# Patient Record
Sex: Female | Born: 1965 | Race: Black or African American | Hispanic: No | Marital: Single | Smoking: Former smoker
Health system: Southern US, Community
[De-identification: ages and names within clinical notes are randomized; demographics above are authoritative.]

## PROBLEM LIST (undated history)

## (undated) DIAGNOSIS — I890 Lymphedema, not elsewhere classified: Secondary | ICD-10-CM

## (undated) DIAGNOSIS — J45909 Unspecified asthma, uncomplicated: Secondary | ICD-10-CM

## (undated) HISTORY — PX: TUBAL LIGATION: SHX77

## (undated) HISTORY — DX: Lymphedema, not elsewhere classified: I89.0

---

## 2011-07-22 ENCOUNTER — Emergency Department: Payer: Self-pay | Admitting: Emergency Medicine

## 2011-09-04 ENCOUNTER — Emergency Department (HOSPITAL_COMMUNITY)
Admission: EM | Admit: 2011-09-04 | Discharge: 2011-09-04 | Disposition: A | Discharge status: Home / Self Care | Payer: Self-pay | Attending: Emergency Medicine | Admitting: Emergency Medicine

## 2011-09-04 DIAGNOSIS — Z87891 Personal history of nicotine dependence: Secondary | ICD-10-CM | POA: Insufficient documentation

## 2011-09-04 DIAGNOSIS — J40 Bronchitis, not specified as acute or chronic: Secondary | ICD-10-CM | POA: Insufficient documentation

## 2012-01-02 ENCOUNTER — Emergency Department (HOSPITAL_COMMUNITY)
Admission: EM | Admit: 2012-01-02 | Discharge: 2012-01-02 | Disposition: A | Discharge status: Home / Self Care | Payer: Self-pay | Attending: Emergency Medicine | Admitting: Emergency Medicine

## 2012-01-02 ENCOUNTER — Encounter (HOSPITAL_COMMUNITY): Payer: Self-pay | Admitting: *Deleted

## 2012-01-02 ENCOUNTER — Emergency Department (HOSPITAL_COMMUNITY): Payer: Self-pay

## 2012-01-02 DIAGNOSIS — R059 Cough, unspecified: Secondary | ICD-10-CM | POA: Insufficient documentation

## 2012-01-02 DIAGNOSIS — J40 Bronchitis, not specified as acute or chronic: Secondary | ICD-10-CM

## 2012-01-02 DIAGNOSIS — J069 Acute upper respiratory infection, unspecified: Secondary | ICD-10-CM | POA: Insufficient documentation

## 2012-01-02 DIAGNOSIS — R05 Cough: Secondary | ICD-10-CM | POA: Insufficient documentation

## 2012-01-02 MED ORDER — AZITHROMYCIN 250 MG PO TABS: 250.0000 mg | ORAL_TABLET | Freq: Every day | ORAL | Status: AC

## 2012-01-02 MED ORDER — BENZONATATE 100 MG PO CAPS: 100.0000 mg | ORAL_CAPSULE | Freq: Three times a day (TID) | ORAL | Status: AC

## 2012-01-02 NOTE — ED Provider Notes (Signed)
History     CSN: [id number]  Arrival date & time 01/02/12  4098   First MD Initiated Contact with Patient 01/02/12 [phone]      Chief Complaint  Patient presents with  . URI    (Consider location/radiation/quality/duration/timing/severity/associated sxs/prior treatment) HPI  Pt presents to the ED with complaints of flu-like symptoms of cough, congestion, sore throat, fever.  The patient states that the symptoms started 1 week ago.  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 The patient denies headaches, neck pain, weakness, vision changes, severe abdominal pain, inability to eat or drink, difficulty breathing, SOB, wheezing, chest pain. The patient has tried cough medicine, NSAIDS, and rest but has only felt mild relief.    History reviewed. No pertinent past medical history.  History reviewed. No pertinent past surgical history.  No family history on file.  History  Substance Use Topics  . Smoking status: Current Some Day Smoker  . Smokeless tobacco: Not on file  . Alcohol Use: Yes     ocassionally    OB History    Grav Para Term Preterm Abortions TAB SAB Ect Mult Living                  Review of Systems  All other systems reviewed and are negative.    Allergies  Review of patient's allergies indicates no known allergies.  Home Medications   Current Outpatient Rx  Name Route Sig Dispense Refill  . VITAMIN C PO Oral Take 1 tablet by mouth daily.    Marland Kitchen VITAMIN B-12 PO Oral Take 1 tablet by mouth daily.    . IBUPROFEN 200 MG PO TABS Oral Take 600 mg by mouth every 6 (six) hours as needed. For pain.    Marland Kitchen AZITHROMYCIN 250 MG PO TABS Oral Take 1 tablet (250 mg total) by mouth daily. Take first 2 tablets together, then 1 every day until finished. 6 tablet 0  . BENZONATATE 100 MG PO CAPS Oral Take 1 capsule (100 mg total) by mouth every 8 (eight) hours. 21 capsule 0    BP 119/84  Pulse  78  Temp(Src) 97.9 F (36.6 C) (Oral)  Resp 16  Wt 205 lb (92.987 kg)  SpO2 100%  LMP 12/12/2011  Physical Exam  Nursing note and vitals reviewed. Constitutional: She appears well-developed and well-nourished.  HENT:  Head: Normocephalic and atraumatic.  Nose: Rhinorrhea present.  Eyes: Conjunctivae are normal. Pupils are equal, round, and reactive to light.  Neck: Trachea normal, normal range of motion and full passive range of motion without pain. Neck supple.  Cardiovascular: Normal rate, regular rhythm and normal pulses.   Pulmonary/Chest: Effort normal. She has no wheezes. Chest wall is not dull to percussion. She exhibits no tenderness, no crepitus, no edema, no deformity and no retraction.  Abdominal: Soft. Normal appearance and bowel sounds are normal.  Musculoskeletal: Normal range of motion.  Lymphadenopathy:       Head (right side): No submental, no submandibular, no tonsillar, no preauricular, no posterior auricular and no occipital adenopathy present.       Head (left side): No submental, no submandibular, no tonsillar, no preauricular, no posterior auricular and no occipital adenopathy present.    She has no cervical adenopathy.    She has no axillary adenopathy.  Neurological: She is alert. She has normal strength.  Skin: Skin is warm, dry and intact.  Psychiatric: Her  speech is normal. Cognition and memory are normal.    ED Course  Procedures (including critical care time)  Labs Reviewed - No data to display Dg Chest 2 View  01/02/2012  *RADIOLOGY REPORT*  Clinical Data: Cough, congestion  CHEST - 2 VIEW  Comparison: None.  Findings: Normal heart cardiac silhouette and mediastinal contours. Evaluation of the retrosternal clear space is limited secondary to overlying osseous and soft tissue structures.  There is mild peribronchial thickening without focal airspace opacity.  No pleural effusion or pneumothorax.  Unchanged bones.  IMPRESSION: Findings suggestive of  airways disease/bronchitis.  No focal airspace opacities to suggest pneumonia.  Original Report Authenticated By: Waynard Reeds, M.D.     1. 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        Dorthula Matas, PA 01/02/12 1455

## 2012-01-02 NOTE — ED Notes (Signed)
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

## 2012-01-03 NOTE — ED Provider Notes (Signed)
Medical screening examination/treatment/procedure(s) were conducted as a shared visit with non-physician practitioner(s) and myself.  I personally evaluated the patient during the encounter   Brynlei Klausner, MD 01/03/12 0904 

## 2012-09-13 IMAGING — CR DG CHEST 2V
2 series · 2 of 2 positions shown · non-contrast
Comparison: None.

CLINICAL DATA: Cough, congestion

CHEST - 2 VIEW

[w chest pa]
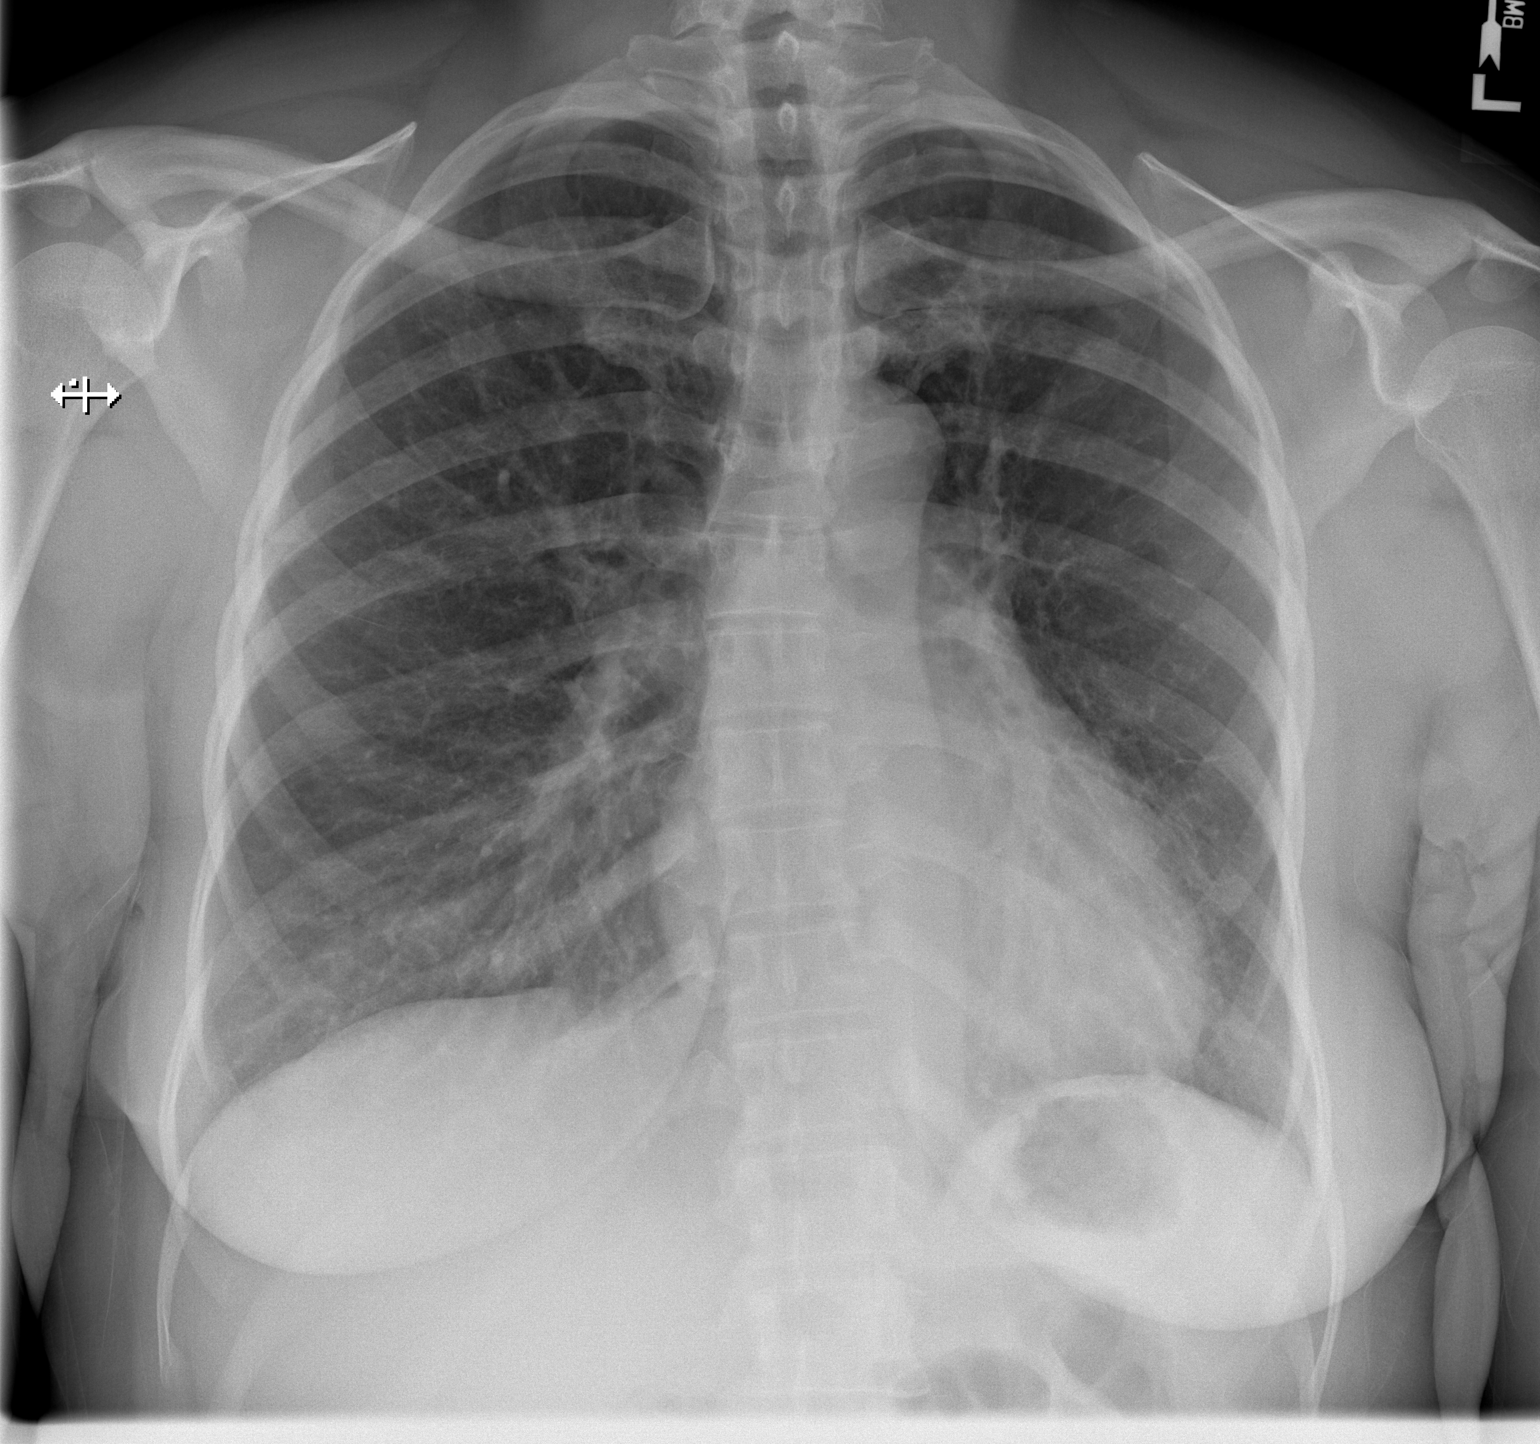

[w chest lat]
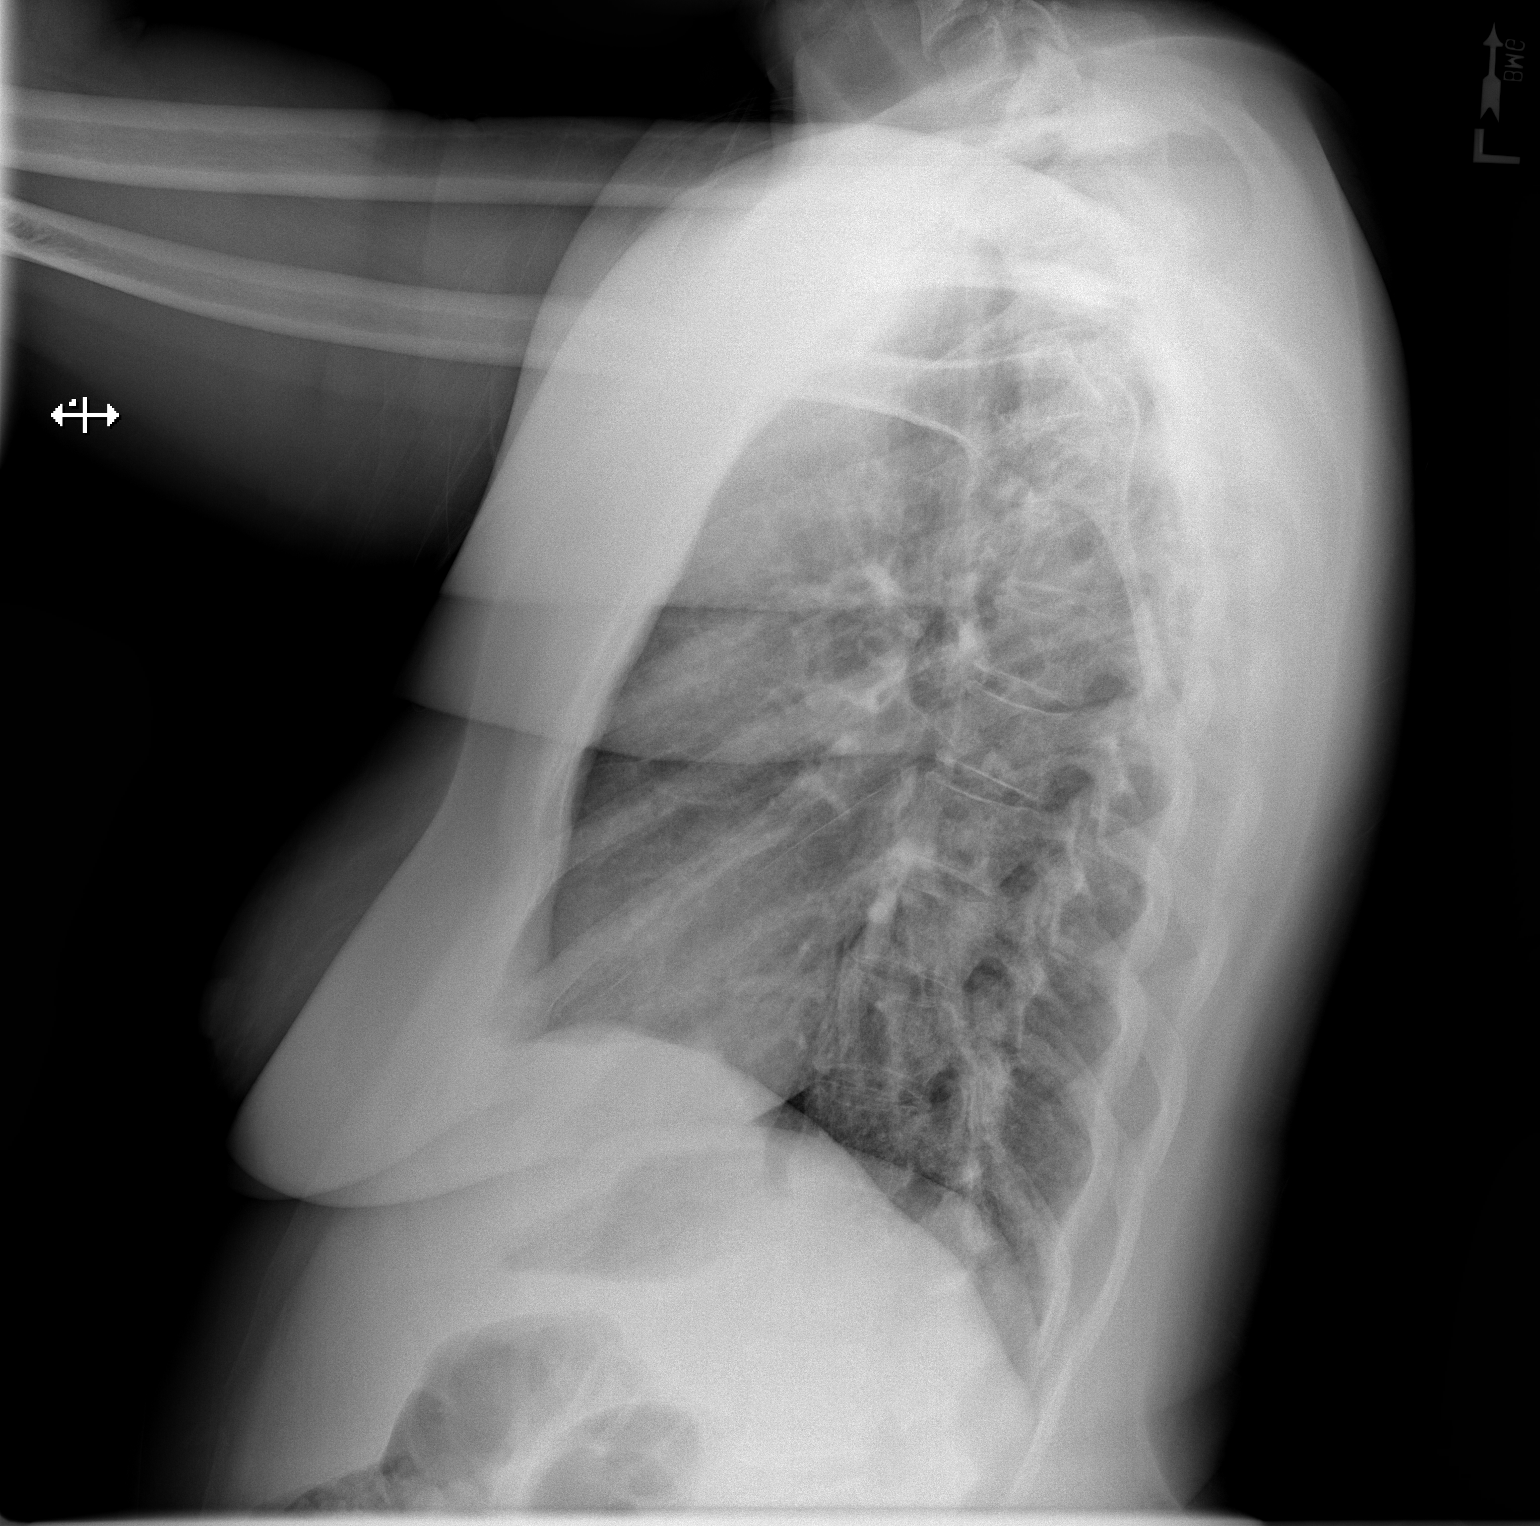

[2 of 2 positions shown; findings below may reference images not displayed]

FINDINGS: Normal heart cardiac silhouette and mediastinal contours.
Evaluation of the retrosternal clear space is limited secondary to
overlying osseous and soft tissue structures.  There is mild
peribronchial thickening without focal airspace opacity.  No
pleural effusion or pneumothorax.  Unchanged bones.
IMPRESSION: Findings suggestive of airways disease/bronchitis.  No focal
airspace opacities to suggest pneumonia.

## 2012-11-29 ENCOUNTER — Emergency Department (HOSPITAL_COMMUNITY)
Admission: EM | Admit: 2012-11-29 | Discharge: 2012-11-29 | Disposition: A | Discharge status: Home / Self Care | Payer: BC Managed Care – PPO | Attending: Emergency Medicine | Admitting: Emergency Medicine

## 2012-11-29 ENCOUNTER — Emergency Department (HOSPITAL_COMMUNITY): Payer: BC Managed Care – PPO

## 2012-11-29 ENCOUNTER — Encounter (HOSPITAL_COMMUNITY): Payer: Self-pay | Admitting: Emergency Medicine

## 2012-11-29 DIAGNOSIS — Z87891 Personal history of nicotine dependence: Secondary | ICD-10-CM | POA: Insufficient documentation

## 2012-11-29 DIAGNOSIS — R110 Nausea: Secondary | ICD-10-CM | POA: Insufficient documentation

## 2012-11-29 DIAGNOSIS — Z3202 Encounter for pregnancy test, result negative: Secondary | ICD-10-CM | POA: Insufficient documentation

## 2012-11-29 DIAGNOSIS — N760 Acute vaginitis: Secondary | ICD-10-CM | POA: Insufficient documentation

## 2012-11-29 DIAGNOSIS — K439 Ventral hernia without obstruction or gangrene: Secondary | ICD-10-CM | POA: Insufficient documentation

## 2012-11-29 DIAGNOSIS — N898 Other specified noninflammatory disorders of vagina: Secondary | ICD-10-CM | POA: Insufficient documentation

## 2012-11-29 LAB — URINALYSIS, ROUTINE W REFLEX MICROSCOPIC
Bilirubin Urine: NEGATIVE
Hgb urine dipstick: NEGATIVE
Nitrite: NEGATIVE
Specific Gravity, Urine: 1.024 (ref 1.005–1.030)
pH: 5.5 (ref 5.0–8.0)

## 2012-11-29 LAB — COMPREHENSIVE METABOLIC PANEL
ALT: 20 U/L (ref 0–35)
AST: 18 U/L (ref 0–37)
Calcium: 9.1 mg/dL (ref 8.4–10.5)
Sodium: 138 mEq/L (ref 135–145)
Total Protein: 8.1 g/dL (ref 6.0–8.3)

## 2012-11-29 LAB — WET PREP, GENITAL: Trich, Wet Prep: NONE SEEN

## 2012-11-29 LAB — CBC
MCH: 32.2 pg (ref 26.0–34.0)
MCHC: 33.6 g/dL (ref 30.0–36.0)
Platelets: 223 10*3/uL (ref 150–400)

## 2012-11-29 LAB — POCT PREGNANCY, URINE: Preg Test, Ur: NEGATIVE

## 2012-11-29 MED ORDER — SODIUM CHLORIDE 0.9 % IV BOLUS (SEPSIS)
1000.0000 mL | Freq: Once | INTRAVENOUS | Status: AC
  Administered 2012-11-29: 1000 mL via INTRAVENOUS

## 2012-11-29 MED ORDER — IBUPROFEN 600 MG PO TABS: 600.0000 mg | ORAL_TABLET | Freq: Four times a day (QID) | ORAL | Status: DC | PRN

## 2012-11-29 MED ORDER — IOHEXOL 300 MG/ML  SOLN
80.0000 mL | Freq: Once | INTRAMUSCULAR | Status: AC | PRN
  Administered 2012-11-29: 80 mL via INTRAVENOUS

## 2012-11-29 MED ORDER — IOHEXOL 300 MG/ML  SOLN
20.0000 mL | INTRAMUSCULAR | Status: AC
  Administered 2012-11-29 (×2): 20 mL via ORAL

## 2012-11-29 MED ORDER — IBUPROFEN 800 MG PO TABS
800.0000 mg | ORAL_TABLET | Freq: Once | ORAL | Status: AC
  Administered 2012-11-29: 800 mg via ORAL
  Filled 2012-11-29: qty 1

## 2012-11-29 MED ORDER — METRONIDAZOLE 500 MG PO TABS: 500.0000 mg | ORAL_TABLET | Freq: Two times a day (BID) | ORAL | Status: DC

## 2012-11-29 NOTE — ED Notes (Signed)
Patient described pain as "annoying, it's not really sharp or anything, it's just annoying".

## 2012-11-29 NOTE — ED Notes (Signed)
Pt undressed, in gown 

## 2012-11-29 NOTE — ED Notes (Signed)
Surgeon at bedside.  

## 2012-11-29 NOTE — ED Provider Notes (Signed)
History     CSN: [id number]  Arrival date & time 11/29/12  3244   First MD Initiated Contact with Patient 11/29/12 [phone]      Chief Complaint  Patient presents with  . Abdominal Pain    (Consider location/radiation/quality/duration/timing/severity/associated sxs/prior treatment) Patient is a 46 y.o. female presenting with abdominal pain. The history is provided by the patient. No language interpreter was used.  Abdominal Pain The primary symptoms of the illness include abdominal pain, nausea and vaginal discharge. The primary symptoms of the illness do not include fever, fatigue, vomiting, diarrhea or dysuria. The current episode started more than 2 days ago. The onset of the illness was gradual. The problem has been gradually worsening.  The vaginal discharge is not associated with dysuria.  Symptoms associated with the illness do not include chills, constipation, urgency, hematuria, frequency or back pain.   46 year old female coming in with complaint of right lower quadrant pain and vaginal discharge x4 days. Patient is sexually active with one partner. Patient uses no means of birth control. Last menstrual cycle ended on Monday. States that the pain in her right lower quadrant is 5/10 at worst.  States that the pain comes on less than 5 times a day. Patient denies nausea and vomiting. Patient also has a painless questionable mass supraumbilically. States that the masses but didn't care for more than a year. 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 She was going to make an appointment with Dr. polite today decided to come to the ER instead. She's also made an appointment to go to the bariatric clinic next week. Past medical history tubal ligation.  History reviewed. No pertinent past medical history.  History reviewed. No pertinent past surgical history.  No family history on file.  History  Substance Use Topics  . Smoking status: Former Games developer  . Smokeless tobacco: Not on file  .  Alcohol Use: Yes     Comment: ocassionally    OB History    Grav Para Term Preterm Abortions TAB SAB Ect Mult Living                  Review of Systems  Constitutional: Negative.  Negative for fever, chills and fatigue.  HENT: Negative.   Eyes: Negative.   Respiratory: Negative.   Cardiovascular: Negative.  Negative for chest pain.  Gastrointestinal: Positive for nausea, abdominal pain and abdominal distention. Negative for vomiting, diarrhea, constipation and blood in stool.  Genitourinary: Positive for vaginal discharge. Negative for dysuria, urgency, frequency and hematuria.  Musculoskeletal: Negative for back pain.  Neurological: Negative.   Psychiatric/Behavioral: Negative.   All other systems reviewed and are negative.    Allergies  Review of patient's allergies indicates no known allergies.  Home Medications   Current Outpatient Rx  Name  Route  Sig  Dispense  Refill  . TYLENOL PO   Oral   Take 1 tablet by mouth every 6 (six) hours as needed. For pain         . IBUPROFEN 200 MG PO TABS   Oral   Take 600 mg by mouth every 6 (six) hours as needed. For pain.         . ADULT MULTIVITAMIN W/MINERALS CH   Oral   Take 1 tablet by mouth daily.           BP 120/76  Pulse 88  Temp 97.3 F (36.3 C) (Oral)  Resp 20  Ht 5\' 7"  (1.702 m)  Wt 220  lb (99.791 kg)  BMI 34.46 kg/m2  SpO2 100%  LMP 11/24/2012  Physical Exam  Nursing note and vitals reviewed. Constitutional: She is oriented to person, place, and time. She appears well-developed and well-nourished.  HENT:  Head: Normocephalic and atraumatic.  Eyes: Conjunctivae normal and EOM are normal. Pupils are equal, round, and reactive to light.  Neck: Normal range of motion. Neck supple.  Cardiovascular: Normal rate.   Pulmonary/Chest: Effort normal.  Abdominal: Soft. Bowel sounds are normal. She exhibits distension and mass. There is tenderness. There is no rebound and no guarding.       Abdominal  distension supra umbilicus,  ? Hernia with no tenderness.  RLQ tenderness 5/10  Genitourinary: Uterus normal. Vaginal discharge found.  Musculoskeletal: Normal range of motion. She exhibits no edema and no tenderness.  Neurological: She is alert and oriented to person, place, and time. She has normal reflexes.  Skin: Skin is warm and dry.  Psychiatric: She has a normal mood and affect.    ED Course  Pelvic exam Date/Time: 11/29/2012 2:33 PM Performed by: Remi Haggard Authorized by: Remi Haggard Consent: Verbal consent obtained. Written consent not obtained. Risks and benefits: risks, benefits and alternatives were discussed Consent given by: patient Patient understanding: patient states understanding of the procedure being performed Patient identity confirmed: verbally with patient, arm band, provided demographic data and hospital-assigned identification number Time out: Immediately prior to procedure a "time out" was called to verify the correct patient, procedure, equipment, support staff and site/side marked as required. Preparation: Patient was prepped and draped in the usual sterile fashion. Patient tolerance: Patient tolerated the procedure well with no immediate complications.   (including critical care time)   Surgery notified of painless ventral hernia with incarceration that cannot be excluded.  PA at bedside to evaluate.  Hernia is reducible and painless.  She will follow up.   Labs Reviewed  CBC - Abnormal; Notable for the following:    RBC 3.82 (*)     All other components within normal limits  COMPREHENSIVE METABOLIC PANEL  URINALYSIS, ROUTINE W REFLEX MICROSCOPIC  WET PREP, GENITAL  GC/CHLAMYDIA PROBE AMP   No results found.   No diagnosis found.    MDM  RLQ abdominal pain with a ventral hernia per CT reviewed by myself.  Bacterial vaginosis with rx for flagyl. Ibuprofen for pain.  Follow up with surgery for the hernia.  Follow up with gyn for BV.   Understands to return for worsening symptoms.  Labs unremarkable.    Labs Reviewed  CBC - Abnormal; Notable for the following:    RBC 3.82 (*)     All other components within normal limits  COMPREHENSIVE METABOLIC PANEL - Abnormal; Notable for the following:    Glucose, Bld 103 (*)     All other components within normal limits  URINALYSIS, ROUTINE W REFLEX MICROSCOPIC - Abnormal; Notable for the following:    Ketones, ur 15 (*)     All other components within normal limits  WET PREP, GENITAL - Abnormal; Notable for the following:    Clue Cells Wet Prep HPF POC MODERATE (*)     All other components within normal limits  POCT PREGNANCY, URINE  GC/CHLAMYDIA PROBE AMP          Remi Haggard, NP 11/29/12 2137

## 2012-11-29 NOTE — ED Notes (Signed)
Patient discharged per PA and Surgeon. Verbalized understanding and discharge instructions.

## 2012-11-29 NOTE — ED Notes (Signed)
Disregard all discharge information.  Patient is not being discharged at this time.   Reevaluation by MD.

## 2012-11-29 NOTE — ED Notes (Signed)
Patient claims that she normally has abdominal pain after menstruation, but claims that it usually subsides.   Patient claims "i just don't feel right, it's a pain in my abdomen".  Patient could not describe further.

## 2012-12-01 NOTE — ED Provider Notes (Signed)
Medical screening examination/treatment/procedure(s) were performed by non-physician practitioner and as supervising physician I was immediately available for consultation/collaboration.   Laray Anger, DO 12/01/12 1312

## 2013-02-06 ENCOUNTER — Encounter (HOSPITAL_COMMUNITY): Payer: Self-pay | Admitting: *Deleted

## 2013-02-06 ENCOUNTER — Emergency Department (HOSPITAL_COMMUNITY)
Admission: EM | Admit: 2013-02-06 | Discharge: 2013-02-06 | Disposition: A | Discharge status: Home / Self Care | Payer: Managed Care, Other (non HMO) | Attending: Emergency Medicine | Admitting: Emergency Medicine

## 2013-02-06 ENCOUNTER — Emergency Department (HOSPITAL_COMMUNITY): Payer: Managed Care, Other (non HMO)

## 2013-02-06 DIAGNOSIS — R0602 Shortness of breath: Secondary | ICD-10-CM | POA: Insufficient documentation

## 2013-02-06 DIAGNOSIS — Z87891 Personal history of nicotine dependence: Secondary | ICD-10-CM | POA: Insufficient documentation

## 2013-02-06 DIAGNOSIS — R059 Cough, unspecified: Secondary | ICD-10-CM | POA: Insufficient documentation

## 2013-02-06 DIAGNOSIS — R079 Chest pain, unspecified: Secondary | ICD-10-CM | POA: Insufficient documentation

## 2013-02-06 DIAGNOSIS — R51 Headache: Secondary | ICD-10-CM | POA: Insufficient documentation

## 2013-02-06 DIAGNOSIS — M79609 Pain in unspecified limb: Secondary | ICD-10-CM | POA: Insufficient documentation

## 2013-02-06 DIAGNOSIS — J209 Acute bronchitis, unspecified: Secondary | ICD-10-CM | POA: Insufficient documentation

## 2013-02-06 MED ORDER — CYCLOBENZAPRINE HCL 10 MG PO TABS
10.0000 mg | ORAL_TABLET | Freq: Once | ORAL | Status: AC
  Administered 2013-02-06: 10 mg via ORAL
  Filled 2013-02-06: qty 1

## 2013-02-06 MED ORDER — AZITHROMYCIN 250 MG PO TABS: 250.0000 mg | ORAL_TABLET | Freq: Every day | ORAL | Status: DC

## 2013-02-06 MED ORDER — CYCLOBENZAPRINE HCL 10 MG PO TABS: 10.0000 mg | ORAL_TABLET | Freq: Two times a day (BID) | ORAL | Status: DC | PRN

## 2013-02-06 MED ORDER — DEXTROMETHORPHAN POLISTIREX 30 MG/5ML PO LQCR
10.0000 mL | Freq: Once | ORAL | Status: AC
  Administered 2013-02-06: 60 mg via ORAL
  Filled 2013-02-06: qty 10

## 2013-02-06 MED ORDER — ALBUTEROL SULFATE HFA 108 (90 BASE) MCG/ACT IN AERS
2.0000 | INHALATION_SPRAY | RESPIRATORY_TRACT | Status: DC | PRN
  Administered 2013-02-06: 2 via RESPIRATORY_TRACT
  Filled 2013-02-06: qty 6.7

## 2013-02-06 MED ORDER — DEXTROMETHORPHAN HBR 15 MG/5ML PO SYRP: 10.0000 mL | ORAL_SOLUTION | Freq: Four times a day (QID) | ORAL | Status: DC | PRN

## 2013-02-06 NOTE — ED Notes (Signed)
Pt reports productive cough cough x 2 days with green sputum production. Also having intermittent pain and swelling to posterior right lower leg. Ambulatory at triage, airway intact.

## 2013-02-06 NOTE — ED Provider Notes (Signed)
History     CSN: [id number]  Arrival date & time 02/06/13  1804   First MD Initiated Contact with Patient 02/06/13 2208      Chief Complaint  Patient presents with  . Cough  . Leg Pain    (Consider location/radiation/quality/duration/timing/severity/associated sxs/prior treatment) HPI Comments: Patient is a 47 year old female who presents with a 1 week history of productive cough. Patient reports gradual onset and progressive worsening of symptoms. Patient reports associated SOB and chest pain with cough as well as headache and nasal congestion. No aggravating/alleviating factors. Patient denies sick contacts. Patient has tried ibuprofen for pain which has provided no relief. Patient reports anterior right lower leg pain that started gradually and is most noticeable when the patient is on her feet. She is concerned about a blood clot. Patient has no previous history of DVT/PE, no exogenous estrogen, and no recent surgery/travel/trauma.   Patient is a 47 y.o. female presenting with cough and leg pain.  Cough Associated symptoms: chest pain and shortness of breath   Leg Pain   History reviewed. No pertinent past medical history.  History reviewed. No pertinent past surgical history.  History reviewed. No pertinent family history.  History  Substance Use Topics  . Smoking status: Former Games developer  . Smokeless tobacco: Not on file  . Alcohol Use: Yes     Comment: ocassionally    OB History   Grav Para Term Preterm Abortions TAB SAB Ect Mult Living                  Review of Systems  Respiratory: Positive for cough and shortness of breath.   Cardiovascular: Positive for chest pain.  All other systems reviewed and are negative.    Allergies  Review of patient's allergies indicates no known allergies.  Home Medications   Current Outpatient Rx  Name  Route  Sig  Dispense  Refill  . ibuprofen (ADVIL,MOTRIN) 200 MG tablet   Oral   Take 600 mg by mouth every 6 (six)  hours as needed. For pain.         . Multiple Vitamin (MULTIVITAMIN WITH MINERALS) TABS   Oral   Take 1 tablet by mouth daily.           BP 137/92  Pulse 76  Temp(Src) 97.4 F (36.3 C) (Oral)  Resp 20  SpO2 99%  LMP 01/18/2013  Physical Exam  Nursing note and vitals reviewed. Constitutional: She is oriented to person, place, and time. She appears well-developed and well-nourished. No distress.  HENT:  Head: Normocephalic and atraumatic.  Eyes: Conjunctivae and EOM are normal.  Neck: Normal range of motion. Neck supple.  Cardiovascular: Normal rate and regular rhythm.  Exam reveals no gallop and no friction rub.   No murmur heard. Pulmonary/Chest: Effort normal and breath sounds normal. She has no wheezes. She has no rales. She exhibits no tenderness.  Abdominal: Soft. She exhibits no distension. There is no tenderness. There is no rebound.  Musculoskeletal: Normal range of motion.  Neurological: She is alert and oriented to person, place, and time. Coordination normal.  Speech is goal-oriented. Moves limbs without ataxia.   Skin: Skin is warm and dry.  Psychiatric: She has a normal mood and affect. Her behavior is normal.    ED Course  Procedures (including critical care time)  Labs Reviewed - No data to display Dg Chest 2 View  02/06/2013  *RADIOLOGY REPORT*  Clinical Data: Cough.  CHEST - 2 VIEW  Comparison: 01/02/2012.  Findings: The cardiac silhouette, mediastinal and hilar contours are within normal limits and stable.  There are chronic bronchitic interstitial lung changes without definite acute overlying pulmonary process.  No pleural effusion.  No pneumothorax.  The bony thorax is intact.  IMPRESSION: Chronic lung changes without definite acute overlying pulmonary process.   Original Report Authenticated By: Rudie Meyer, M.D.      1. Bronchitis   2. Leg pain, anterior, right       MDM  10:51 PM Chest xray shows bronchitic changes. I will treat her as  bronchitis with a Z-pack, cough suppressant, albuterol inhaler, and flexeril for chest muscle soreness. Patient is PERC negative. Patient afebrile and vitals stable for discharge. Patient instructed to return with worsening or concerning symptoms.         Emilia Beck, PA-C 02/07/13 0028

## 2013-02-07 NOTE — ED Provider Notes (Signed)
Medical screening examination/treatment/procedure(s) were performed by non-physician practitioner and as supervising physician I was immediately available for consultation/collaboration.  Flint Melter, MD 02/07/13 1626

## 2014-10-29 ENCOUNTER — Emergency Department (HOSPITAL_COMMUNITY)
Admission: EM | Admit: 2014-10-29 | Discharge: 2014-10-30 | Disposition: A | Discharge status: Home / Self Care | Payer: BC Managed Care – PPO | Attending: Emergency Medicine | Admitting: Emergency Medicine

## 2014-10-29 ENCOUNTER — Encounter (HOSPITAL_COMMUNITY): Payer: Self-pay

## 2014-10-29 DIAGNOSIS — M79661 Pain in right lower leg: Secondary | ICD-10-CM | POA: Insufficient documentation

## 2014-10-29 DIAGNOSIS — Z87891 Personal history of nicotine dependence: Secondary | ICD-10-CM | POA: Insufficient documentation

## 2014-10-29 DIAGNOSIS — Z792 Long term (current) use of antibiotics: Secondary | ICD-10-CM | POA: Insufficient documentation

## 2014-10-29 DIAGNOSIS — M79604 Pain in right leg: Secondary | ICD-10-CM

## 2014-10-29 DIAGNOSIS — Z79899 Other long term (current) drug therapy: Secondary | ICD-10-CM | POA: Insufficient documentation

## 2014-10-29 NOTE — ED Notes (Signed)
Pt complains of lower leg and knee pain on the right side for two weeks, no injury noted, pt describes pain as achy

## 2014-10-30 ENCOUNTER — Other Ambulatory Visit (HOSPITAL_COMMUNITY): Payer: Self-pay | Admitting: Emergency Medicine

## 2014-10-30 ENCOUNTER — Encounter (HOSPITAL_COMMUNITY): Payer: BC Managed Care – PPO

## 2014-10-30 DIAGNOSIS — M7989 Other specified soft tissue disorders: Secondary | ICD-10-CM

## 2014-10-30 MED ORDER — ENOXAPARIN SODIUM 100 MG/ML ~~LOC~~ SOLN
1.0000 mg/kg | Freq: Once | SUBCUTANEOUS | Status: AC
  Administered 2014-10-30: 100 mg via SUBCUTANEOUS
  Filled 2014-10-30: qty 1

## 2014-10-30 MED ORDER — IBUPROFEN 800 MG PO TABS: 800.0000 mg | ORAL_TABLET | Freq: Three times a day (TID) | ORAL | Status: DC | PRN

## 2014-10-30 MED ORDER — IBUPROFEN 800 MG PO TABS
800.0000 mg | ORAL_TABLET | Freq: Once | ORAL | Status: AC
  Administered 2014-10-30: 800 mg via ORAL
  Filled 2014-10-30: qty 1

## 2014-10-30 NOTE — ED Provider Notes (Signed)
CSN: [id number]     Arrival date & time 10/29/14  2321 History   First MD Initiated Contact with Patient 10/29/14 2350     Chief Complaint  Patient presents with  . Leg Pain     (Consider location/radiation/quality/duration/timing/severity/associated sxs/prior Treatment) HPI  [name] is a 48 y.o. female with no significant past medical history coming in with right leg pain for the past 2 weeks. Patient states her pain feels like a muscle ache. It is worse in her calf and knee. She states it became steadily more worse over the last 2 days. She's been taking Tylenol increasingly without any relief of her pain. She denies any history of blood clots, trauma, or Baker's cyst. She denies any swelling in the leg. She's had no recent long periods of immobilization or hormonal therapy. Since denying any chest pain or shortness of breath. She has no further complaints.   10 Systems reviewed and are negative for acute change except as noted in the HPI.    History reviewed. No pertinent past medical history. History reviewed. No pertinent past surgical history. History reviewed. No pertinent family history. History  Substance Use Topics  . Smoking status: Former Games developermoker  . Smokeless tobacco: Not on file  . Alcohol Use: Yes     Comment: ocassionally   OB History    No data available     Review of Systems    Allergies  Review of patient's allergies indicates no known allergies.  Home Medications   Prior to Admission medications   Medication Sig Start Date End Date Taking? Authorizing Provider  ibuprofen (ADVIL,MOTRIN) 200 MG tablet Take 600 mg by mouth every 6 (six) hours as needed. For pain.   Yes Historical Provider, MD  Multiple Vitamin (MULTIVITAMIN WITH MINERALS) TABS Take 1 tablet by mouth daily.   Yes Historical Provider, MD  vitamin B-12 (CYANOCOBALAMIN) 1000 MCG tablet Take 1,000 mcg by mouth daily.   Yes Historical Provider, MD  azithromycin (ZITHROMAX Z-PAK) 250 MG  tablet Take 1 tablet (250 mg total) by mouth daily. 500mg  PO day 1, then 250mg  PO days 205 02/06/13   Kaitlyn Szekalski, PA-C  cyclobenzaprine (FLEXERIL) 10 MG tablet Take 1 tablet (10 mg total) by mouth 2 (two) times daily as needed for muscle spasms. Patient not taking: Reported on 10/29/2014 02/06/13   Emilia BeckKaitlyn Szekalski, PA-C  dextromethorphan 15 MG/5ML syrup Take 10 mLs (30 mg total) by mouth 4 (four) times daily as needed for cough. Patient not taking: Reported on 10/29/2014 02/06/13   Kaitlyn Szekalski, PA-C   BP 140/94 mmHg  Pulse 90  Temp(Src) 98.2 F (36.8 C) (Oral)  Resp 24  Ht 5\' 6"  (1.676 m)  Wt 225 lb (102.059 kg)  BMI 36.33 kg/m2  SpO2 100%  LMP 10/12/2014 Physical Exam  Constitutional: She is oriented to person, place, and time. She appears well-developed and well-nourished. No distress.  HENT:  Head: Normocephalic and atraumatic.  Nose: Nose normal.  Mouth/Throat: Oropharynx is clear and moist. No oropharyngeal exudate.  Eyes: Conjunctivae and EOM are normal. Pupils are equal, round, and reactive to light. No scleral icterus.  Neck: Normal range of motion. Neck supple. No JVD present. No tracheal deviation present. No thyromegaly present.  Cardiovascular: Normal rate, regular rhythm and normal heart sounds.  Exam reveals no gallop and no friction rub.   No murmur heard. Pulmonary/Chest: Effort normal and breath sounds normal. No respiratory distress. She has no wheezes. She exhibits no tenderness.  Abdominal: Soft. Bowel  sounds are normal. She exhibits no distension and no mass. There is no tenderness. There is no rebound and no guarding.  Musculoskeletal: Normal range of motion. She exhibits tenderness. She exhibits no edema.  Right lower extremity tenderness to palpation of the calf muscle. Negative Homans sign. There is no edema.  Right knee is not swollen, there is no warmth. There is normal range of motion and 2+ pulses distally.  Lymphadenopathy:    She has no  cervical adenopathy.  Neurological: She is alert and oriented to person, place, and time.  Skin: Skin is warm and dry. No rash noted. She is not diaphoretic. No erythema. No pallor.  Nursing note and vitals reviewed.   ED Course  Procedures (including critical care time) Labs Review Labs Reviewed - No data to display  Imaging Review No results found.   EKG Interpretation None      MDM   Final diagnoses:  None    Patient since emergency department for right lower extremity pain. Differential includes Baker's cyst versus DVT.  Her history is concerning for possible blood clot, physical exam does not support this however. I see no swelling but she does have calf tenderness. Because of this, patient was given Lovenox in the emergency department. Advised to follow-up tomorrow for an ultrasound. She was also given Motrin. Her vital signs remain within her normal limits and she is safe for discharge.    Tomasita CrumbleAdeleke Servando Kyllonen, MD 10/30/14 [phone]

## 2014-10-30 NOTE — Discharge Instructions (Signed)
Deep Vein Thrombosis Ms. Brittany Beasley, you were seen today for leg pain. You may have a blood clot and need [facility]. Your given a blood thinner. If any of your symptoms worsen or you develop chest pain or shortness of breath come back to emergency department immediately for repeat evaluation. Thank you. A deep vein thrombosis (DVT) is a blood clot that develops in the deep, larger veins of the leg, arm, or pelvis. These are more dangerous than clots that might form in veins near the surface of the body. A DVT can lead to serious and even life-threatening complications if the clot breaks off and travels in the bloodstream to the lungs.  A DVT can damage the valves in your leg veins so that instead of flowing upward, the blood pools in the lower leg. This is called post-thrombotic syndrome, and it can result in pain, swelling, discoloration, and sores on the leg. CAUSES Usually, several things contribute to the formation of blood clots. Contributing factors include:  The flow of blood slows down.  The inside of the vein is damaged in some way.  You have a condition that makes blood clot more easily. RISK FACTORS Some people are more likely than others to develop blood clots. Risk factors include:   Smoking.  Being overweight (obese).  Sitting or lying still for a long time. This includes long-distance travel, paralysis, or recovery from an illness or surgery. Other factors that increase risk are:   Older age, especially over 47 years of age.  Having a family history of blood clots or if you have already had a blot clot.  Having major or lengthy surgery. This is especially true for surgery on the hip, knee, or belly (abdomen). Hip surgery is particularly high risk.  Having a long, thin tube (catheter) placed inside a vein during a medical procedure.  Breaking a hip or leg.  Having cancer or cancer treatment.  Pregnancy and childbirth.  Hormone  changes make the blood clot more easily during pregnancy.  The fetus puts pressure on the veins of the pelvis.  There is a risk of injury to veins during delivery or a caesarean delivery. The risk is highest just after childbirth.  Medicines containing the female hormone estrogen. This includes birth control pills and hormone replacement therapy.  Other circulation or heart problems.  SIGNS AND SYMPTOMS When a clot forms, it can either partially or totally block the blood flow in that vein. Symptoms of a DVT can include:  Swelling of the leg or arm, especially if one side is much worse.  Warmth and redness of the leg or arm, especially if one side is much worse.  Pain in an arm or leg. If the clot is in the leg, symptoms may be more noticeable or worse when standing or walking. The symptoms of a DVT that has traveled to the lungs (pulmonary embolism, PE) usually start suddenly and include:  Shortness of breath.  Coughing.  Coughing up blood or blood-tinged mucus.  Chest pain. The chest pain is often worse with deep breaths.  Rapid heartbeat. Anyone with these symptoms should get emergency medical treatment right away. Do not wait to see if the symptoms will go away. Call your local emergency services (911 in the [place] if you have these symptoms. Do not drive yourself to the hospital. DIAGNOSIS If a DVT is suspected, your health care provider will take a full medical history and perform a physical exam.  Tests that also may be required include:  Blood tests, including studies of the clotting properties of the blood.  Ultrasound to see if you have clots in your legs or lungs.  X-rays to show the flow of blood when dye is injected into the veins (venogram).  Studies of your lungs if you have any chest symptoms. PREVENTION  Exercise the legs regularly. Take a brisk 30-minute walk every day.  Maintain a weight that is appropriate for your height.  Avoid sitting or lying in bed  for long periods of time without moving your legs.  Women, particularly those over the age of 35 years, should consider the risks and benefits of taking estrogen medicines, including birth control pills.  Do not smoke, especially if you take estrogen medicines.  Long-distance travel can increase your risk of DVT. You should exercise your legs by walking or pumping the muscles every hour.  Many of the risk factors above relate to situations that exist with hospitalization, either for illness, injury, or elective surgery. Prevention may include medical and nonmedical measures.  Your health care provider will assess you for the need for venous thromboembolism prevention when you are admitted to the hospital. If you are having surgery, your surgeon will assess you the day of or day after surgery. TREATMENT Once identified, a DVT can be treated. It can also be prevented in some circumstances. Once you have had a DVT, you may be at increased risk for a DVT in the future. The most common treatment for DVT is blood-thinning (anticoagulant) medicine, which reduces the blood's tendency to clot. Anticoagulants can stop new blood clots from forming and stop old clots from growing. They cannot dissolve existing clots. Your body does this by itself over time. Anticoagulants can be given by mouth, through an IV tube, or by injection. Your health care provider will determine the best program for you. Other medicines or treatments that may be used are:  Heparin or related medicines (low molecular weight heparin) are often the first treatment for a blood clot. They act quickly. However, they cannot be taken orally and must be given either in shot form or by IV tube.  Heparin can cause a fall in a component of blood that stops bleeding and forms blood clots (platelets). You will be monitored with blood tests to be sure this does not occur.  Warfarin is an anticoagulant that can be swallowed. It takes a few days to  start working, so usually heparin or related medicines are used in combination. Once warfarin is working, heparin is usually stopped.  Factor Xa inhibitor medicines, such as rivaroxaban and apixaban, also reduce blood clotting. These medicines are taken orally and can often be used without heparin or related medicines.  Less commonly, clot dissolving drugs (thrombolytics) are used to dissolve a DVT. They carry a high risk of bleeding, so they are used mainly in severe cases where your life or a part of your body is threatened.  Very rarely, a blood clot in the leg needs to be removed surgically.  If you are unable to take anticoagulants, your health care provider may arrange for you to have a filter placed in a main vein in your abdomen. This filter prevents clots from traveling to your lungs. HOME CARE INSTRUCTIONS  Take all medicines as directed by your health care provider.  Learn as much as you can about DVT.  Wear a medical alert bracelet or carry a medical alert card.  Ask your health care  provider how soon you can go back to normal activities. It is important to stay active to prevent blood clots. If you are on anticoagulant medicine, avoid contact sports.  It is very important to exercise. This is especially important while traveling, sitting, or standing for long periods of time. Exercise your legs by walking or by tightening and relaxing your leg muscles regularly. Take frequent walks.  You may need to wear compression stockings. These are tight elastic stockings that apply pressure to the lower legs. This pressure can help keep the blood in the legs from clotting. Taking Warfarin Warfarin is a daily medicine that is taken by mouth. Your health care provider will advise you on the length of treatment (usually 3-6 months, sometimes lifelong). If you take warfarin:  Understand how to take warfarin and foods that can affect how warfarin works in Public relations account executive.  Too much and too little  warfarin are both dangerous. Too much warfarin increases the risk of bleeding. Too little warfarin continues to allow the risk for blood clots. Warfarin and Regular Blood Testing While taking warfarin, you will need to have regular blood tests to measure your blood clotting time. These blood tests usually include both the prothrombin time (PT) and international normalized ratio (INR) tests. The PT and INR results allow your health care provider to adjust your dose of warfarin. It is very important that you have your PT and INR tested as often as directed by your health care provider.  Warfarin and Your Diet Avoid major changes in your diet, or notify your health care provider before changing your diet. Arrange a visit with a registered dietitian to answer your questions. Many foods, especially foods high in vitamin K, can interfere with warfarin and affect the PT and INR results. You should eat a consistent amount of foods high in vitamin K. Foods high in vitamin K include:   Spinach, kale, broccoli, cabbage, collard and turnip greens, Brussels sprouts, peas, cauliflower, seaweed, and parsley.  Beef and pork liver.  Green tea.  Soybean oil. Warfarin with Other Medicines Many medicines can interfere with warfarin and affect the PT and INR results. You must:  Tell your health care provider about any and all medicines, vitamins, and supplements you take, including aspirin and other over-the-counter anti-inflammatory medicines. Be especially cautious with aspirin and anti-inflammatory medicines. Ask your health care provider before taking these.  Do not take or discontinue any prescribed or over-the-counter medicine except on the advice of your health care provider or pharmacist. Warfarin Side Effects Warfarin can have side effects, such as easy bruising and difficulty stopping bleeding. Ask your health care provider or pharmacist about other side effects of warfarin. You will need to:  Hold  pressure over cuts for longer than usual.  Notify your dentist and other health care providers that you are taking warfarin before you undergo any procedures where bleeding may occur. Warfarin with Alcohol and Tobacco   Drinking alcohol frequently can increase the effect of warfarin, leading to excess bleeding. It is best to avoid alcoholic drinks or to consume only very small amounts while taking warfarin. Notify your health care provider if you change your alcohol intake.   Do not use any tobacco products including cigarettes, chewing tobacco, or electronic cigarettes. If you smoke, quit. Ask your health care provider for help with quitting smoking. Alternative Medicines to Warfarin: Factor Xa Inhibitor Medicines  These blood-thinning medicines are taken by mouth, usually for several weeks or longer. It is important to  take the medicine every single day at the same time each day.  There are no regular blood tests required when using these medicines.  There are fewer food and drug interactions than with warfarin.  The side effects of this class of medicine are similar to those of warfarin, including excessive bruising or bleeding. Ask your health care provider or pharmacist about other potential side effects. SEEK MEDICAL CARE IF:  You notice a rapid heartbeat.  You feel weaker or more tired than usual.  You feel faint.  You notice increased bruising.  You feel your symptoms are not getting better in the time expected.  You believe you are having side effects of medicine. SEEK IMMEDIATE MEDICAL CARE IF:  You have chest pain.  You have trouble breathing.  You have new or increased swelling or pain in one leg.  You cough up blood.  You notice blood in vomit, in a bowel movement, or in urine. MAKE SURE YOU:  Understand these instructions.  Will watch your condition.  Will get help right away if you are not doing well or get worse. Document Released: 12/05/2005 Document  Revised: 04/21/2014 Document Reviewed: 08/12/2013 [facility] Patient Information 2015 [employer] [place]. This information is not intended to replace advice given to you by your health care provider. Make sure you discuss any questions you have with your health care provider.

## 2014-10-31 ENCOUNTER — Ambulatory Visit (HOSPITAL_COMMUNITY)
Admission: RE | Admit: 2014-10-31 | Discharge: 2014-10-31 | Disposition: A | Discharge status: Home / Self Care | Payer: BC Managed Care – PPO | Source: Ambulatory Visit | Attending: Emergency Medicine | Admitting: Emergency Medicine

## 2014-10-31 DIAGNOSIS — M7989 Other specified soft tissue disorders: Secondary | ICD-10-CM

## 2014-10-31 NOTE — Progress Notes (Signed)
Right lower extremity venous duplex completed.  Right:  No evidence of DVT, superficial thrombosis, or Baker's cyst.  Left:  Negative for DVT in the common femoral vein.  

## 2015-05-01 ENCOUNTER — Emergency Department (HOSPITAL_COMMUNITY)
Admission: EM | Admit: 2015-05-01 | Discharge: 2015-05-01 | Disposition: A | Discharge status: Home / Self Care | Payer: Self-pay | Attending: Emergency Medicine | Admitting: Emergency Medicine

## 2015-05-01 ENCOUNTER — Emergency Department (HOSPITAL_COMMUNITY): Payer: Self-pay

## 2015-05-01 ENCOUNTER — Encounter (HOSPITAL_COMMUNITY): Payer: Self-pay | Admitting: Emergency Medicine

## 2015-05-01 DIAGNOSIS — Z87891 Personal history of nicotine dependence: Secondary | ICD-10-CM | POA: Insufficient documentation

## 2015-05-01 DIAGNOSIS — Z79899 Other long term (current) drug therapy: Secondary | ICD-10-CM | POA: Insufficient documentation

## 2015-05-01 DIAGNOSIS — J189 Pneumonia, unspecified organism: Secondary | ICD-10-CM

## 2015-05-01 DIAGNOSIS — J159 Unspecified bacterial pneumonia: Secondary | ICD-10-CM | POA: Insufficient documentation

## 2015-05-01 DIAGNOSIS — Z3202 Encounter for pregnancy test, result negative: Secondary | ICD-10-CM | POA: Insufficient documentation

## 2015-05-01 DIAGNOSIS — R059 Cough, unspecified: Secondary | ICD-10-CM

## 2015-05-01 DIAGNOSIS — R609 Edema, unspecified: Secondary | ICD-10-CM | POA: Insufficient documentation

## 2015-05-01 DIAGNOSIS — Z792 Long term (current) use of antibiotics: Secondary | ICD-10-CM | POA: Insufficient documentation

## 2015-05-01 DIAGNOSIS — R05 Cough: Secondary | ICD-10-CM

## 2015-05-01 LAB — BASIC METABOLIC PANEL
ANION GAP: 9 (ref 5–15)
BUN: 12 mg/dL (ref 6–20)
CHLORIDE: 106 mmol/L (ref 101–111)
CO2: 26 mmol/L (ref 22–32)
Calcium: 8.9 mg/dL (ref 8.9–10.3)
Creatinine, Ser: 0.47 mg/dL (ref 0.44–1.00)
Glucose, Bld: 104 mg/dL — ABNORMAL HIGH (ref 65–99)
Potassium: 3.5 mmol/L (ref 3.5–5.1)
Sodium: 141 mmol/L (ref 135–145)

## 2015-05-01 LAB — I-STAT TROPONIN, ED: Troponin i, poc: 0 ng/mL (ref 0.00–0.08)

## 2015-05-01 LAB — CBC WITH DIFFERENTIAL/PLATELET
BASOS PCT: 1 % (ref 0–1)
Basophils Absolute: 0 10*3/uL (ref 0.0–0.1)
EOS ABS: 0.2 10*3/uL (ref 0.0–0.7)
EOS PCT: 4 % (ref 0–5)
HEMATOCRIT: 35.1 % — AB (ref 36.0–46.0)
HEMOGLOBIN: 11.5 g/dL — AB (ref 12.0–15.0)
Lymphocytes Relative: 29 % (ref 12–46)
Lymphs Abs: 1.2 10*3/uL (ref 0.7–4.0)
MCH: 31.8 pg (ref 26.0–34.0)
MCHC: 32.8 g/dL (ref 30.0–36.0)
MCV: 97 fL (ref 78.0–100.0)
MONOS PCT: 9 % (ref 3–12)
Monocytes Absolute: 0.4 10*3/uL (ref 0.1–1.0)
Neutro Abs: 2.4 10*3/uL (ref 1.7–7.7)
Neutrophils Relative %: 57 % (ref 43–77)
Platelets: 227 10*3/uL (ref 150–400)
RBC: 3.62 MIL/uL — ABNORMAL LOW (ref 3.87–5.11)
RDW: 13.3 % (ref 11.5–15.5)
WBC: 4.3 10*3/uL (ref 4.0–10.5)

## 2015-05-01 LAB — POC URINE PREG, ED: Preg Test, Ur: NEGATIVE

## 2015-05-01 LAB — BRAIN NATRIURETIC PEPTIDE: B NATRIURETIC PEPTIDE 5: 14 pg/mL (ref 0.0–100.0)

## 2015-05-01 MED ORDER — AZITHROMYCIN 250 MG PO TABS
500.0000 mg | ORAL_TABLET | Freq: Once | ORAL | Status: AC
  Administered 2015-05-01: 500 mg via ORAL
  Filled 2015-05-01: qty 2

## 2015-05-01 MED ORDER — AZITHROMYCIN 250 MG PO TABS: 250.0000 mg | ORAL_TABLET | Freq: Every day | ORAL | Status: DC

## 2015-05-01 NOTE — ED Provider Notes (Signed)
CSN: [id number]     Arrival date & time 05/01/15  0709 History   First MD Initiated Contact with Patient 05/01/15 [phone]     Chief Complaint  Patient presents with  . Cough     (Consider location/radiation/quality/duration/timing/severity/associated sxs/prior Treatment) Patient is a 49 y.o. female presenting with cough. The history is provided by the patient.  Cough Associated symptoms: myalgias and shortness of breath   Associated symptoms: no chest pain, no fever, no headaches, no rash and no wheezing    patient with complaint of cough and productive cough and swelling in the ankles for 2 weeks. Denies any chest pain. Does have some congestion no sore throat. Does have some bodyaches. No chest pain but does have some mild shortness of breath but denies any wheezing.  History reviewed. No pertinent past medical history. Past Surgical History  Procedure Laterality Date  . Tubal ligation     History reviewed. No pertinent family history. History  Substance Use Topics  . Smoking status: Former Games developermoker  . Smokeless tobacco: Never Used  . Alcohol Use: Yes     Comment: ocassionally   OB History    Gravida Para Term Preterm AB TAB SAB Ectopic Multiple Living   3 3 3             Review of Systems  Constitutional: Negative for fever.  HENT: Positive for congestion.   Eyes: Negative for visual disturbance.  Respiratory: Positive for cough and shortness of breath. Negative for wheezing.   Cardiovascular: Negative for chest pain.  Gastrointestinal: Negative for nausea, vomiting, abdominal pain and diarrhea.  Genitourinary: Negative for dysuria.  Musculoskeletal: Positive for myalgias.  Skin: Negative for rash.  Neurological: Negative for headaches.  Hematological: Does not bruise/bleed easily.  Psychiatric/Behavioral: Negative for confusion.      Allergies  Review of patient's allergies indicates no known allergies.  Home Medications   Prior to Admission medications    Medication Sig Start Date End Date Taking? Authorizing Provider  ibuprofen (ADVIL,MOTRIN) 200 MG tablet Take 600 mg by mouth every 6 (six) hours as needed for headache, moderate pain or cramping.   Yes Historical Provider, MD  Prenatal Vit-Fe Fumarate-FA (PRENATAL MULTIVITAMIN) TABS tablet Take 1 tablet by mouth daily at 12 noon.   Yes Historical Provider, MD  azithromycin (ZITHROMAX Z-PAK) 250 MG tablet Take 1 tablet (250 mg total) by mouth daily. 500mg  PO day 1, then 250mg  PO days 205 Patient not taking: Reported on 05/01/2015 02/06/13   Emilia BeckKaitlyn Szekalski, PA-C  azithromycin (ZITHROMAX) 250 MG tablet Take 1 tablet (250 mg total) by mouth daily. Take first 2 tablets together, then 1 every day until finished. 05/01/15   Vanetta MuldersScott Smitty Ackerley, MD  cyclobenzaprine (FLEXERIL) 10 MG tablet Take 1 tablet (10 mg total) by mouth 2 (two) times daily as needed for muscle spasms. Patient not taking: Reported on 10/29/2014 02/06/13   Emilia BeckKaitlyn Szekalski, PA-C  dextromethorphan 15 MG/5ML syrup Take 10 mLs (30 mg total) by mouth 4 (four) times daily as needed for cough. Patient not taking: Reported on 10/29/2014 02/06/13   Emilia BeckKaitlyn Szekalski, PA-C  ibuprofen (ADVIL,MOTRIN) 800 MG tablet Take 1 tablet (800 mg total) by mouth every 8 (eight) hours as needed for moderate pain. Patient not taking: Reported on 05/01/2015 10/30/14   Tomasita CrumbleAdeleke Oni, MD   BP 154/91 mmHg  Pulse 74  Temp(Src) 97.9 F (36.6 C) (Oral)  Resp 25  Ht 5\' 7"  (1.702 m)  Wt 216 lb (97.977 kg)  BMI 33.82 kg/m2  SpO2 100%  LMP 04/17/2015 Physical Exam  Constitutional: She is oriented to person, place, and time. She appears well-developed and well-nourished. No distress.  HENT:  Head: Normocephalic and atraumatic.  Mouth/Throat: Oropharynx is clear and moist.  Eyes: Conjunctivae and EOM are normal. Pupils are equal, round, and reactive to light.  Neck: Normal range of motion. Neck supple.  Cardiovascular: Normal rate, regular rhythm and normal heart  sounds.   No murmur heard. Pulmonary/Chest: Effort normal and breath sounds normal. No respiratory distress.  Abdominal: Soft. Bowel sounds are normal. There is no tenderness.  Musculoskeletal: Normal range of motion. She exhibits edema.  Bilateral pitting edema to both legs.  Neurological: She is alert and oriented to person, place, and time. No cranial nerve deficit. She exhibits normal muscle tone. Coordination normal.  Skin: Skin is warm. No rash noted.  Nursing note and vitals reviewed.   ED Course  Procedures (including critical care time) Labs Review Labs Reviewed  CBC WITH DIFFERENTIAL/PLATELET - Abnormal; Notable for the following:    RBC 3.62 (*)    Hemoglobin 11.5 (*)    HCT 35.1 (*)    All other components within normal limits  BASIC METABOLIC PANEL - Abnormal; Notable for the following:    Glucose, Bld 104 (*)    All other components within normal limits  BRAIN NATRIURETIC PEPTIDE  POC URINE PREG, ED  I-STAT TROPOININ, ED   Results for orders placed or performed during the hospital encounter of 05/01/15  CBC with Differential/Platelet  Result Value Ref Range   WBC 4.3 4.0 - 10.5 K/uL   RBC 3.62 (L) 3.87 - 5.11 MIL/uL   Hemoglobin 11.5 (L) 12.0 - 15.0 g/dL   HCT 16.135.1 (L) 09.636.0 - 04.546.0 %   MCV 97.0 78.0 - 100.0 fL   MCH 31.8 26.0 - 34.0 pg   MCHC 32.8 30.0 - 36.0 g/dL   RDW 40.913.3 81.111.5 - 91.415.5 %   Platelets 227 150 - 400 K/uL   Neutrophils Relative % 57 43 - 77 %   Neutro Abs 2.4 1.7 - 7.7 K/uL   Lymphocytes Relative 29 12 - 46 %   Lymphs Abs 1.2 0.7 - 4.0 K/uL   Monocytes Relative 9 3 - 12 %   Monocytes Absolute 0.4 0.1 - 1.0 K/uL   Eosinophils Relative 4 0 - 5 %   Eosinophils Absolute 0.2 0.0 - 0.7 K/uL   Basophils Relative 1 0 - 1 %   Basophils Absolute 0.0 0.0 - 0.1 K/uL  Basic metabolic panel  Result Value Ref Range   Sodium 141 135 - 145 mmol/L   Potassium 3.5 3.5 - 5.1 mmol/L   Chloride 106 101 - 111 mmol/L   CO2 26 22 - 32 mmol/L   Glucose, Bld 104  (H) 65 - 99 mg/dL   BUN 12 6 - 20 mg/dL   Creatinine, Ser 7.820.47 0.44 - 1.00 mg/dL   Calcium 8.9 8.9 - 95.610.3 mg/dL   GFR calc non Af Amer >60 >60 mL/min   GFR calc Af Amer >60 >60 mL/min   Anion gap 9 5 - 15  Brain natriuretic peptide  Result Value Ref Range   B Natriuretic Peptide 14.0 0.0 - 100.0 pg/mL  POC Urine Pregnancy, ED ([facility])  Result Value Ref Range   Preg Test, Ur NEGATIVE NEGATIVE  I-Stat Troponin, ED ([facility])  Result Value Ref Range   Troponin i, poc 0.00 0.00 - 0.08 ng/mL   Comment  3             Imaging Review Dg Chest 2 View  05/01/2015   CLINICAL DATA:  Cough for 4 days  EXAM: CHEST  2 VIEW  COMPARISON:  February 06, 2013  FINDINGS: There is patchy fibrotic type change in the lower lung zones bilaterally. There is no edema or consolidation. Heart is upper normal in size with pulmonary vascularity within normal limits. No adenopathy. No bone lesions.  IMPRESSION: Patchy fibrotic type change in the bases. No edema or consolidation. No new opacity.   Electronically Signed   By: Bretta Bang III M.D.   On: 05/01/2015 08:00   Ct Chest Wo Contrast  05/01/2015   CLINICAL DATA:  Persistent cough over the last 2 days. Swelling of both ankles.  EXAM: CT CHEST WITHOUT CONTRAST  TECHNIQUE: Multidetector CT imaging of the chest was performed following the standard protocol without IV contrast.  COMPARISON:  Two-view chest x-ray from the same day.  FINDINGS: The heart size is normal. No significant mediastinal or axillary adenopathy is present. There is no significant pleural or pericardial effusion.  Limited imaging of the upper abdomen is within normal limits.  The lung windows demonstrate mild diffuse ground-glass attenuation in the right lung. Scattered micro nodularity is present throughout the superior segment of the right lower lobe. Nodular airspace disease is present in the superior segment of the left lower lobe as well, slightly more consolidated. Her  bronchial nodularity and thickening is present in the upper lobes bilaterally.  Bone windows are unremarkable.  IMPRESSION: 1. Bilateral superior segment lower lobe and upper lobe micronodular airspace opacities. This is most concerning for atypical infection. There is minimal consolidation. 2. Mild diffuse ground-glass attenuation is more prominent on the right. This may represent edema. 3.   Electronically Signed   By: Marin Roberts M.D.   On: 05/01/2015 08:45     EKG Interpretation   Date/Time:  Friday May 01 2015 09:15:28 EDT Ventricular Rate:  71 PR Interval:  231 QRS Duration: 73 QT Interval:  400 QTC Calculation: 435 R Axis:   81 Text Interpretation:  Sinus rhythm Prolonged PR interval Anterior infarct,  old Baseline wander in lead(s) V6 No significant change since last tracing  Confirmed by Mathews Stuhr  MD, Zachary Lovins [phone]) on 05/01/2015 9:23:12 AM      MDM   Final diagnoses:  Cough  CAP (community acquired pneumonia)    Extensive workup for the cough and productive cough and for the swelling in the legs. Chest x-ray of raise concerns for possible atypical pneumonia. CT of the chest shows the same there also was some evidence of maybe a little bit of a interstitial edema on the CT of the chest. Workup for that without any significant findings. No evidence of a silent MI. No sniffing elevation in BNP. In addition renal function is normal.  Patient started on Zithromax here will be continued on Zithromax for the presumed community-acquired pneumonia. Work note provided. Patient nontoxic no acute distress. No hypoxia.    Vanetta Mulders, MD 05/01/15 1016

## 2015-05-01 NOTE — ED Notes (Signed)
Pt reports productive cough with yellowish colored sputum. Pt states sputum is "medium in consistency." Pt also c/o L ankle edema and pain. Pt reports the ankle pain has been painful for 2 weeks.

## 2015-05-01 NOTE — ED Notes (Signed)
Pt states that she has been coughing for the past few days and her ankles have been swelling off and on for 2 weeks.

## 2015-05-01 NOTE — Discharge Instructions (Signed)
Follow-up for the swelling in the legs will be important. Resource guide provided below for further evaluation. In addition chest x-ray shows evidence of probably of pneumonia. Take antibiotic as directed. Would expect you to be improving in 2-3 days. Return for any new or worse symptoms.   Emergency Department Resource Guide 1) Find a Doctor and Pay Out of Pocket Although you won't have to find out who is covered by your insurance plan, it is a good idea to ask around and get recommendations. You will then need to call the office and see if the doctor you have chosen will accept you as a new patient and what types of options they offer for patients who are self-pay. Some doctors offer discounts or will set up payment plans for their patients who do not have insurance, but you will need to ask so you aren't surprised when you get to your appointment.  2) Contact Your Local Health Department Not all health departments have doctors that can see patients for sick visits, but many do, so it is worth a call to see if yours does. If you don't know where your local health department is, you can check in your phone book. The CDC also has a tool to help you locate your state's health department, and many state websites also have listings of all of their local health departments.  3) Find a Walk-in Clinic If your illness is not likely to be very severe or complicated, you may want to try a walk in clinic. These are popping up all over the country in pharmacies, drugstores, and shopping centers. They're usually staffed by nurse practitioners or physician assistants that have been trained to treat common illnesses and complaints. They're usually fairly quick and inexpensive. However, if you have serious medical issues or chronic medical problems, these are probably not your best option.  No Primary Care Doctor: - Call Health Connect at  [phone] - they can help you locate a primary care doctor that  accepts your  insurance, provides certain services, etc. - Physician Referral Service- [phone]  Chronic Pain Problems: Organization         Address  Phone   Notes  [facility]  [phone] Patients need to be referred by their primary care doctor.   Medication Assistance: Organization         Address  Phone   Notes  [phone] [facility] [address] [place], [place] [phone] --Must be a resident of [place] -- Must have NO insurance coverage whatsoever (no Medicaid/ Medicare, etc.) -- The pt. MUST have a primary care doctor that directs their care regularly and follows them in the community   MedAssist  [phone]   [employer]  [phone]    Agencies that provide inexpensive medical care: Organization         Address  Phone   Notes  [facility]  [phone]   [facility]    [phone]   [facility] [address] [place], [place] [address] [phone]   [facility] [address], [place] [phone]   [facility]anned Parenthood    [phone]   [facility]    [phone]   [facility]  [address], [place] Phone:  [phone], Fax:  [phone] Hours of Operation:  9 am - 6  pm, M-F.  Also accepts Medicaid/Medicare and self-pay.  [facility] for [address] [place], [address], [place] Phone: [phone], Fax: [phone]. Hours of Operation:  8:30 am - 5:30 pm, M-F.  Also accepts Medicaid and self-pay.  [facility] [address] [place] Phone: [phone]   [place], [place], [place] [phone], Ext. [phone] Mondays & Thursdays: 7-9 AM.  First 15 patients are seen on a first come, first serve basis.    [facility] Providers:  Organization          Address  Phone   Notes  [facility] [address], [place] [phone] Also accepts self-pay patients.  [facility] [address], [place]  [phone]   [place], [address], [place] [phone]   [facility] [address] [place] [phone]   Lucianne Lei [address], [place]   [phone] Only accepts [place] Access [place] patients after they have their name applied to their card.   Self-Pay (no insurance) in [facility]:  Organization         Address  Phone   Notes  Sickle Cell Patients, [facility] [place] [phone]   [facility] [place] [phone]   [facility] [place]  [place], [place], [place] [phone]   [facility]/Dr. Osei-Bonsu  [address] [place] or [address], [place] [phone] Phone number for both [place] and [place] locations is the same.  Urgent Medical and [facility] [address], [place] [phone]   [facility] [address] [place] or [address] [phone]   [facility] [address] [place] [phone], phone; [phone], fax Sees patients 1st and 3rd Saturday of every month.  Must not qualify for public or private insurance (i.e. Medicaid, Medicare, [place] Health Choice, Veterans' Benefits)  Household income should be no more than 200% of the poverty level The clinic cannot treat you if you are pregnant or think you are pregnant  Sexually transmitted diseases are not treated at the clinic.    Dental Care: Organization         Address  Phone  Notes  [facility] [place] [phone] Accepts children up to age 78 who are enrolled in [place] or [place]; pregnant women with a Medicaid card; and children who have applied for Medicaid or [place] Health Choice, but were declined, whose parents can pay a reduced fee at time of service.  [facility] Department of [facility]  [address], [place] [phone] Accepts children up to age 76 who are enrolled in [place] or [place]; pregnant women with a Medicaid card; and children who have applied for Medicaid or  Health Choice, but were declined, whose parents can pay a reduced fee at time of service.  [place] [facility] Access PROGRAM  [place] [phone] Patients are seen by appointment only. Walk-ins are not accepted. [place] will see patients 3 years of age and older.  Monday - Tuesday (8am-5pm) Most Wednesdays (8:30-5pm) $30 per visit, cash only  [facility] Adult Dental Access PROGRAM  [address], [facility] [phone] Patients are seen by appointment only. Walk-ins are not accepted. [place] will see patients 66 years of age and older. One Wednesday Evening (Monthly: Volunteer Based).  $30 per visit, cash only  [place]  [phone] for adults; Children under age 42, call Graduate Pediatric Dentistry at [phone]. Children aged 27-14, please call [phone] to request a pediatric application.  Dental services are provided in all areas of dental care including fillings, crowns and bridges, complete and partial dentures, implants, gum treatment, root canals, and extractions. Preventive care is also provided. Treatment is provided to both adults and children. Patients are selected via a lottery and there is often a waiting list.   [facility] [address], [place]  [phone] [email or web address]   [facility] [address] [place], [place]  [phone], Ext. [phone] Second and Fourth Thursday of each month, opens at 6:30 AM; Clinic ends at 9 AM.  Patients are seen on a first-come first-served basis, and a limited number are seen during each clinic.   [facility]  [address] Hillard Danker [place], [place] [phone]   Eligibility Requirements You must have lived in [place], [place], or [place] counties for at least the last three months.   You cannot be eligible for state or federal sponsored [employer], including [employer], [place], or [employer].   You generally cannot be eligible for healthcare insurance through your employer.    How to apply: Eligibility screenings are held every Tuesday and Wednesday afternoon from 1:00 pm until 4:00 pm. You do not need an appointment for the interview!  [facility] [address], [place], [place]   [place]  [place] Department  [place]  [phone]    Behavioral Health Resources in the Community: Intensive Outpatient Programs Organization         Address  Phone  Notes  [place]. [address], [place], [place] [phone]   [facility] Outpatient [address], [place], [place]   ADS: Alcohol & Drug Svcs [address], [place], [place]   [place] [address]  [place], [place] or [phone]   Substance Abuse Resources Organization         Address  Phone  Notes  Alcohol and Drug Services  [phone]   [place]  [phone]   [facility] [place]   Chinita Pester  [phone]   Residential & Outpatient Substance Abuse Program  [phone]   Psychological Services Organization         Address  Phone  Notes  [facility]  [place]  [phone]    [place] [address] [place] or [phone]    Mobile Crisis Teams Organization         Address  Phone  Notes  [facility], Mobile Crisis Care Unit  [phone]   [facility]  [address] [place], [place]   [place] [address] [place] [phone]    Self-Help/Support Groups Organization         Address  Phone  Notes  [facility] [place] - variety of support groups  [place] Call for more information  Narcotics Anonymous (NA), [facility] [address], [employer] [place]  2 meetings at this location   Special educational needs teacher         Address  Phone  Notes  [facility]idential Treatment [place],    [place]  [phone]   [facility]  [address], [place] 426834, [place], [place]   [place], [place] [phone] Admissions: 8am-3pm M-F  Incentives Substance [place] [address],    [place], [place] [phone]   [facility] [address] [place], [place], [place]   [facility] [address]  [place], [place]   [facility] - Intensive Outpatient [address] Kristeen [address], [place], [place]   [facility] ([place].,  [place], [place] [phone] or [phone]   [facility] Services (RTS) [address] [place], [place] Accepts Medicaid  [facility] [address],  [place] [phone] Substance Abuse/Addiction Treatment   [facility] Organization         Address  Phone  Notes  [facility]  [phone]   Domenic Schwab, PhD [address] Arlis Porta [place], [place]   [phone] or [phone]   [place]  [place], [place] [phone]   [facility] [address], [place], [place] [phone] Insurance/Medicaid/sponsorship through [facility] and Families [address], [address] [place]                                    [place], [place] [phone] [place] [address], [place] [phone]    Dr. Adele Schilder  [phone]   [facility] Dept. 1) [address] [place] 2) [place] 3)  [place] [address], [place] [phone]  [phone]   [place] [phone] or [phone] (After Hours)

## 2016-06-26 ENCOUNTER — Encounter (HOSPITAL_COMMUNITY): Payer: Self-pay | Admitting: *Deleted

## 2016-06-26 ENCOUNTER — Inpatient Hospital Stay (HOSPITAL_COMMUNITY): Payer: Self-pay

## 2016-06-26 ENCOUNTER — Inpatient Hospital Stay (HOSPITAL_COMMUNITY)
Admission: EM | Admit: 2016-06-26 | Discharge: 2016-06-28 | DRG: 871 | Disposition: A | Discharge status: Home / Self Care | Payer: Self-pay | Attending: Family Medicine | Admitting: Family Medicine

## 2016-06-26 ENCOUNTER — Emergency Department (HOSPITAL_COMMUNITY): Payer: Self-pay

## 2016-06-26 ENCOUNTER — Emergency Department (HOSPITAL_COMMUNITY)
Admit: 2016-06-26 | Discharge: 2016-06-26 | Disposition: A | Discharge status: Home / Self Care | Payer: Self-pay | Attending: Emergency Medicine | Admitting: Emergency Medicine

## 2016-06-26 DIAGNOSIS — J45909 Unspecified asthma, uncomplicated: Secondary | ICD-10-CM | POA: Diagnosis present

## 2016-06-26 DIAGNOSIS — R059 Cough, unspecified: Secondary | ICD-10-CM

## 2016-06-26 DIAGNOSIS — Z8049 Family history of malignant neoplasm of other genital organs: Secondary | ICD-10-CM

## 2016-06-26 DIAGNOSIS — R6510 Systemic inflammatory response syndrome (SIRS) of non-infectious origin without acute organ dysfunction: Secondary | ICD-10-CM | POA: Diagnosis present

## 2016-06-26 DIAGNOSIS — M7989 Other specified soft tissue disorders: Secondary | ICD-10-CM

## 2016-06-26 DIAGNOSIS — Z87891 Personal history of nicotine dependence: Secondary | ICD-10-CM

## 2016-06-26 DIAGNOSIS — M79609 Pain in unspecified limb: Secondary | ICD-10-CM

## 2016-06-26 DIAGNOSIS — L0390 Cellulitis, unspecified: Secondary | ICD-10-CM | POA: Diagnosis present

## 2016-06-26 DIAGNOSIS — Z8249 Family history of ischemic heart disease and other diseases of the circulatory system: Secondary | ICD-10-CM

## 2016-06-26 DIAGNOSIS — E871 Hypo-osmolality and hyponatremia: Secondary | ICD-10-CM | POA: Diagnosis present

## 2016-06-26 DIAGNOSIS — R05 Cough: Secondary | ICD-10-CM

## 2016-06-26 DIAGNOSIS — L03116 Cellulitis of left lower limb: Secondary | ICD-10-CM | POA: Diagnosis present

## 2016-06-26 DIAGNOSIS — R000 Tachycardia, unspecified: Secondary | ICD-10-CM

## 2016-06-26 DIAGNOSIS — A419 Sepsis, unspecified organism: Principal | ICD-10-CM | POA: Diagnosis present

## 2016-06-26 DIAGNOSIS — J189 Pneumonia, unspecified organism: Secondary | ICD-10-CM | POA: Diagnosis present

## 2016-06-26 DIAGNOSIS — E876 Hypokalemia: Secondary | ICD-10-CM | POA: Diagnosis present

## 2016-06-26 HISTORY — DX: Unspecified asthma, uncomplicated: J45.909

## 2016-06-26 LAB — CBC
HEMATOCRIT: 36.2 % (ref 36.0–46.0)
HEMOGLOBIN: 11.7 g/dL — AB (ref 12.0–15.0)
MCH: 29.5 pg (ref 26.0–34.0)
MCHC: 32.3 g/dL (ref 30.0–36.0)
MCV: 91.2 fL (ref 78.0–100.0)
Platelets: 162 10*3/uL (ref 150–400)
RBC: 3.97 MIL/uL (ref 3.87–5.11)
RDW: 13.9 % (ref 11.5–15.5)
WBC: 9.6 10*3/uL (ref 4.0–10.5)

## 2016-06-26 LAB — BASIC METABOLIC PANEL
Anion gap: 9 (ref 5–15)
BUN: 11 mg/dL (ref 6–20)
CHLORIDE: 102 mmol/L (ref 101–111)
CO2: 23 mmol/L (ref 22–32)
CREATININE: 0.56 mg/dL (ref 0.44–1.00)
Calcium: 8.9 mg/dL (ref 8.9–10.3)
GFR calc Af Amer: >60 mL/min (ref >60)
GFR calc non Af Amer: >60 mL/min (ref >60)
GLUCOSE: 124 mg/dL — AB (ref 65–99)
Potassium: 3.9 mmol/L (ref 3.5–5.1)
Sodium: 134 mmol/L — ABNORMAL LOW (ref 135–145)

## 2016-06-26 LAB — I-STAT BETA HCG BLOOD, ED (MC, WL, AP ONLY): I-stat hCG, quantitative: <5 m[IU]/mL (ref <5)

## 2016-06-26 LAB — LACTIC ACID, PLASMA
LACTIC ACID, VENOUS: 1.9 mmol/L (ref 0.5–1.9)
Lactic Acid, Venous: 1.7 mmol/L (ref 0.5–1.9)

## 2016-06-26 LAB — URINALYSIS, ROUTINE W REFLEX MICROSCOPIC
BILIRUBIN URINE: NEGATIVE
GLUCOSE, UA: NEGATIVE mg/dL
HGB URINE DIPSTICK: NEGATIVE
Ketones, ur: NEGATIVE mg/dL
Leukocytes, UA: NEGATIVE
Nitrite: NEGATIVE
PH: 6 (ref 5.0–8.0)
Protein, ur: 100 mg/dL — AB
SPECIFIC GRAVITY, URINE: 1.034 — AB (ref 1.005–1.030)

## 2016-06-26 LAB — URINE MICROSCOPIC-ADD ON

## 2016-06-26 LAB — SEDIMENTATION RATE: Sed Rate: 45 mm/hr — ABNORMAL HIGH (ref 0–22)

## 2016-06-26 MED ORDER — MORPHINE SULFATE (PF) 2 MG/ML IV SOLN
1.0000 mg | INTRAVENOUS | Status: DC | PRN
  Administered 2016-06-26 – 2016-06-27 (×4): 1 mg via INTRAVENOUS
  Filled 2016-06-26 (×4): qty 1

## 2016-06-26 MED ORDER — ACETAMINOPHEN 650 MG RE SUPP
650.0000 mg | Freq: Four times a day (QID) | RECTAL | Status: DC | PRN
Start: 2016-06-26 — End: 2016-06-28

## 2016-06-26 MED ORDER — GUAIFENESIN ER 600 MG PO TB12
600.0000 mg | ORAL_TABLET | Freq: Two times a day (BID) | ORAL | Status: DC
  Administered 2016-06-26 – 2016-06-28 (×4): 600 mg via ORAL
  Filled 2016-06-26 (×4): qty 1

## 2016-06-26 MED ORDER — ALBUTEROL SULFATE (2.5 MG/3ML) 0.083% IN NEBU: 2.5000 mg | INHALATION_SOLUTION | RESPIRATORY_TRACT | Status: DC | PRN

## 2016-06-26 MED ORDER — DOCUSATE SODIUM 100 MG PO CAPS
100.0000 mg | ORAL_CAPSULE | Freq: Two times a day (BID) | ORAL | Status: DC
  Administered 2016-06-26 – 2016-06-28 (×4): 100 mg via ORAL
  Filled 2016-06-26 (×4): qty 1

## 2016-06-26 MED ORDER — PIPERACILLIN-TAZOBACTAM 3.375 G IVPB
3.3750 g | Freq: Three times a day (TID) | INTRAVENOUS | Status: DC
  Administered 2016-06-26 – 2016-06-28 (×6): 3.375 g via INTRAVENOUS
  Filled 2016-06-26 (×7): qty 50

## 2016-06-26 MED ORDER — IBUPROFEN 800 MG PO TABS: 800.0000 mg | ORAL_TABLET | Freq: Four times a day (QID) | ORAL | Status: DC | PRN

## 2016-06-26 MED ORDER — IBUPROFEN 800 MG PO TABS
800.0000 mg | ORAL_TABLET | Freq: Four times a day (QID) | ORAL | Status: DC | PRN
  Administered 2016-06-26: 800 mg via ORAL
  Filled 2016-06-26 (×2): qty 1

## 2016-06-26 MED ORDER — VANCOMYCIN HCL IN DEXTROSE 1-5 GM/200ML-% IV SOLN
1000.0000 mg | Freq: Once | INTRAVENOUS | Status: AC
  Administered 2016-06-26: 1000 mg via INTRAVENOUS
  Filled 2016-06-26: qty 200

## 2016-06-26 MED ORDER — ONDANSETRON HCL 4 MG PO TABS: 4.0000 mg | ORAL_TABLET | Freq: Four times a day (QID) | ORAL | Status: DC | PRN

## 2016-06-26 MED ORDER — PIPERACILLIN-TAZOBACTAM 3.375 G IVPB 30 MIN
3.3750 g | INTRAVENOUS | Status: AC
Start: 2016-06-26 — End: 2016-06-26
  Administered 2016-06-26: 3.375 g via INTRAVENOUS
  Filled 2016-06-26: qty 50

## 2016-06-26 MED ORDER — HYDROCODONE-ACETAMINOPHEN 5-325 MG PO TABS
1.0000 | ORAL_TABLET | ORAL | Status: DC | PRN
  Administered 2016-06-26 – 2016-06-28 (×7): 2 via ORAL
  Filled 2016-06-26 (×7): qty 2

## 2016-06-26 MED ORDER — SODIUM CHLORIDE 0.9 % IV BOLUS (SEPSIS)
1000.0000 mL | Freq: Once | INTRAVENOUS | Status: AC
  Administered 2016-06-26: 1000 mL via INTRAVENOUS

## 2016-06-26 MED ORDER — SODIUM CHLORIDE 0.9 % IV SOLN
INTRAVENOUS | Status: DC
  Administered 2016-06-26: 15:00:00 via INTRAVENOUS

## 2016-06-26 MED ORDER — VANCOMYCIN HCL IN DEXTROSE 1-5 GM/200ML-% IV SOLN: 1000.0000 mg | Freq: Once | INTRAVENOUS | Status: DC

## 2016-06-26 MED ORDER — FENTANYL CITRATE (PF) 100 MCG/2ML IJ SOLN
50.0000 ug | Freq: Once | INTRAMUSCULAR | Status: AC
  Administered 2016-06-26: 50 ug via INTRAVENOUS
  Filled 2016-06-26: qty 2

## 2016-06-26 MED ORDER — ACETAMINOPHEN 500 MG PO TABS
1000.0000 mg | ORAL_TABLET | Freq: Once | ORAL | Status: AC
Start: 2016-06-26 — End: 2016-06-26
  Administered 2016-06-26: 1000 mg via ORAL
  Filled 2016-06-26: qty 2

## 2016-06-26 MED ORDER — ACETAMINOPHEN 325 MG PO TABS
650.0000 mg | ORAL_TABLET | Freq: Four times a day (QID) | ORAL | Status: DC | PRN
  Administered 2016-06-26 – 2016-06-27 (×2): 650 mg via ORAL
  Filled 2016-06-26 (×2): qty 2

## 2016-06-26 MED ORDER — SODIUM CHLORIDE 0.9 % IV SOLN
INTRAVENOUS | Status: DC
  Administered 2016-06-26 – 2016-06-28 (×4): via INTRAVENOUS

## 2016-06-26 MED ORDER — VANCOMYCIN HCL IN DEXTROSE 1-5 GM/200ML-% IV SOLN
1000.0000 mg | Freq: Three times a day (TID) | INTRAVENOUS | Status: DC
  Administered 2016-06-26 – 2016-06-28 (×5): 1000 mg via INTRAVENOUS
  Filled 2016-06-26 (×6): qty 200

## 2016-06-26 MED ORDER — ONDANSETRON HCL 4 MG/2ML IJ SOLN: 4.0000 mg | Freq: Four times a day (QID) | INTRAMUSCULAR | Status: DC | PRN

## 2016-06-26 MED ORDER — ENOXAPARIN SODIUM 30 MG/0.3ML ~~LOC~~ SOLN
30.0000 mg | SUBCUTANEOUS | Status: DC
Start: 2016-06-26 — End: 2016-06-27
  Administered 2016-06-26: 30 mg via SUBCUTANEOUS
  Filled 2016-06-26: qty 0.3

## 2016-06-26 MED ORDER — ADULT MULTIVITAMIN W/MINERALS CH
1.0000 | ORAL_TABLET | Freq: Every day | ORAL | Status: DC
  Administered 2016-06-27 – 2016-06-28 (×2): 1 via ORAL
  Filled 2016-06-26 (×2): qty 1

## 2016-06-26 NOTE — ED Notes (Signed)
Patient would like to drink more water before attempting to void. Asked tech to come back in 30 minutes.

## 2016-06-26 NOTE — Progress Notes (Signed)
VASCULAR LAB PRELIMINARY  PRELIMINARY  PRELIMINARY  PRELIMINARY  Left lower extremity venous duplex completed.    Preliminary report:  There is no DVT or SVT noted in the left lower extremity.  There are multiple enlarged lymph nodes noted in the left groin and thigh.   Shilah Hefel, RVT 06/26/2016, 1:17 PM

## 2016-06-26 NOTE — H&P (Signed)
History and Physical    Brittany Beasley ZOX:[id number] DOB: [birth year] DOA: 06/26/2016  PCP: Default, Provider, MD  Patient coming from: Home.   Chief Complaint: cough, fever, leg swelling   HPI: Brittany Beasley is a 50 y.o. female with medical history significant of asthma who presents complaining of dry cough, fever, since 4 days prior to admission. She also notice today left lower extremity swelling, redness. She has not been able to ambulate due to severe swelling. She also report mild headaches, frontal area. No focal deficit.    ED Course: Patient was found to be febrile, at 102, no leukocytosis, Doppler Lower extremities negative for DVT ,multiples lymphadenopathy. Chest x ray; Stable vague prominence of the interstitial markings in both lungs. An underlying interstitial lung disease such as due to sarcoidosis  cannot be excluded.  Review of Systems: As per HPI otherwise 10 point review of systems negative.    Past Medical History  Diagnosis Date  . Asthma     Past Surgical History  Procedure Laterality Date  . 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 She has never used smokeless tobacco. She reports that she drinks alcohol. She reports that she does not use illicit drugs.  No Known Allergies  Family history; HTN, cervical cancer.   Prior to Admission medications   Medication Sig Start Date End Date Taking? Authorizing Provider  ibuprofen (ADVIL,MOTRIN) 200 MG tablet Take 800 mg by mouth every 6 (six) hours as needed for headache, moderate pain or cramping.    Yes Historical Provider, MD  Multiple Vitamin (MULTIVITAMIN WITH MINERALS) TABS tablet Take 1 tablet by mouth daily.   Yes Historical Provider, MD    Physical Exam: Filed Vitals:   06/26/16 1040 06/26/16 1256 06/26/16 1451  BP: 148/92 147/77 120/61  Pulse: 111 111 101  Temp: 102.6 F (39.2 C) 98.9 F (37.2 C)   TempSrc: Oral Oral   Resp: 16 18 20     Height: 5' 6"  (1.676 m)    Weight: 103.42 kg (228 lb)    SpO2: 100% 100% 99%      Constitutional: NAD, calm, comfortable Filed Vitals:   06/26/16 1040 06/26/16 1256 06/26/16 1451  BP: 148/92 147/77 120/61  Pulse: 111 111 101  Temp: 102.6 F (39.2 C) 98.9 F (37.2 C)   TempSrc: Oral Oral   Resp: 16 18 20   Height: 5' 6"  (1.676 m)    Weight: 103.42 kg (228 lb)    SpO2: 100% 100% 99%   Eyes: PERRL, lids and conjunctivae normal ENMT: Mucous membranes are moist. Posterior pharynx clear of any exudate or lesions.Normal dentition.  Neck: normal, supple, no masses, no thyromegaly Respiratory: clear to auscultation bilaterally, no wheezing, no crackles. Normal respiratory effort. No accessory muscle use.  Cardiovascular: Regular rate and rhythm, no murmurs / rubs / gallops. . 2+ pedal pulses. No carotid bruits.  Abdomen: no tenderness, no masses palpated. No hepatosplenomegaly. Bowel sounds positive.  Musculoskeletal: no clubbing / cyanosis. No joint deformity upper and lower extremities. Good ROM, no contractures. Normal muscle tone. Left LE with edema. Redness.  Skin: left lower extremity  with significant edema,andredness.  Neurologic: CN 2-12 grossly intact. Sensation intact, DTR normal. Strength 5/5 in all 4.  Psychiatric: Normal judgment and insight. Alert and oriented x 3. Normal mood.     Labs on Admission: I have personally reviewed following labs and imaging studies  CBC:  Recent Labs Lab 06/26/16 1150  WBC 9.6  HGB 11.7*  HCT 36.2  MCV 91.2  PLT 767   Basic Metabolic Panel:  Recent Labs Lab 06/26/16 1150  NA 134*  K 3.9  CL 102  CO2 23  GLUCOSE 124*  BUN 11  CREATININE 0.56  CALCIUM 8.9   GFR: Estimated Creatinine Clearance: 104.4 mL/min (by C-G formula based on Cr of 0.56). Liver Function Tests: No results for input(s): AST, ALT, ALKPHOS, BILITOT, PROT, ALBUMIN in the last 168 hours. No results for input(s): LIPASE, AMYLASE in the last 168  hours. No results for input(s): AMMONIA in the last 168 hours. Coagulation Profile: No results for input(s): INR, PROTIME in the last 168 hours. Cardiac Enzymes: No results for input(s): CKTOTAL, CKMB, CKMBINDEX, TROPONINI in the last 168 hours. BNP (last 3 results) No results for input(s): PROBNP in the last 8760 hours. HbA1C: No results for input(s): HGBA1C in the last 72 hours. CBG: No results for input(s): GLUCAP in the last 168 hours. Lipid Profile: No results for input(s): CHOL, HDL, LDLCALC, TRIG, CHOLHDL, LDLDIRECT in the last 72 hours. Thyroid Function Tests: No results for input(s): TSH, T4TOTAL, FREET4, T3FREE, THYROIDAB in the last 72 hours. Anemia Panel: No results for input(s): VITAMINB12, FOLATE, FERRITIN, TIBC, IRON, RETICCTPCT in the last 72 hours. Urine analysis:    Component Value Date/Time   COLORURINE YELLOW 06/26/2016 1340   APPEARANCEUR CLOUDY* 06/26/2016 1340   LABSPEC 1.034* 06/26/2016 1340   PHURINE 6.0 06/26/2016 1340   GLUCOSEU NEGATIVE 06/26/2016 1340   HGBUR NEGATIVE 06/26/2016 1340   BILIRUBINUR NEGATIVE 06/26/2016 1340   KETONESUR NEGATIVE 06/26/2016 1340   PROTEINUR 100* 06/26/2016 1340   UROBILINOGEN 0.2 11/29/2012 0918   NITRITE NEGATIVE 06/26/2016 1340   LEUKOCYTESUR NEGATIVE 06/26/2016 1340   Sepsis Labs: !!!!!!!!!!!!!!!!!!!!!!!!!!!!!!!!!!!!!!!!!!!! @LABRCNTIP (procalcitonin:4,lacticidven:4) )No results found for this or any previous visit (from the past 240 hour(s)).   Radiological Exams on Admission: Dg Chest 2 View  06/26/2016  CLINICAL DATA:  Chest pain.  Edema.  Dyspnea. EXAM: CHEST  2 VIEW COMPARISON:  05/01/2015 chest radiograph. FINDINGS: Stable cardiomediastinal silhouette with normal heart size. No pneumothorax. No pleural effusion. Stable vague prominence of the interstitial markings in both lungs. No consolidative airspace disease. IMPRESSION: No consolidative airspace disease to suggest a pneumonia. Stable vague prominence of the  interstitial markings in both lungs. An underlying interstitial lung disease such as due to sarcoidosis cannot be excluded. Consider further evaluation with a high-resolution chest CT study on a short term outpatient basis as clinically warranted. Electronically Signed   By: Ilona Sorrel M.D.   On: 06/26/2016 12:28    EKG: Independently reviewed. Sinus tachycardia   Assessment/Plan Active Problems:   SIRS (systemic inflammatory response syndrome) (HCC)   Cellulitis   PNA (pneumonia)  1-Left lower extremity cellulitis;  Left LE with edema , redness.  IV vancomycin.  Will need follow up for resolution of lymphadenopathy seen on doppler.    2-SIRS;  Related to cellulitis vs PNA.  IV fluids. IV antibiotics,  Check Lactic acid.   3-? PNA; presents with cough,fever. Chest x ray with stable vague interstitial finding ?sarcoid.  Will check ESR, hight resolution CT ruleout infection, sarcoidosis./  Cover with vancomycin and Zosyn, she is a CNA nursing home.    4-Hyponatremia; IV fluids.   5-Screening for HIV.      DVT prophylaxis: lovenox.  Code Status: full code.  Family Communication: care discussed with patient.  Disposition Plan: home in 2 to  3 days  Consults called: none Admission status: inpatient, telemetry    Niel Hummer A MD [facility] Pager [phone]  If 7PM-7AM, please contact night-coverage [email or web address] Password [email or web address]  06/26/2016, 3:19 PM

## 2016-06-26 NOTE — ED Notes (Signed)
Nurse is going to try and collect the  blood

## 2016-06-26 NOTE — ED Notes (Signed)
Per pt report: pt started feeling back on Thursday and took nyquil.  Pt has been increasing her fluid intake.  Pt has not have any improvement.  Pt woke up this morning with a severe headache.  Pt has some SOB with wheezes bilaterally in lungs.  Pt reports edema in left leg.

## 2016-06-26 NOTE — ED Provider Notes (Signed)
CSN: [id number]     Arrival date & time 06/26/16  1007 History   First MD Initiated Contact with Patient 06/26/16 1123     Chief Complaint  Patient presents with  . Headache  . Leg Swelling  . Shortness of Breath     (Consider location/radiation/quality/duration/timing/severity/associated sxs/prior Treatment) HPI Patient presents with concern of cough and dyspnea as well as new left lower from the swelling, redness, pain.  Symptoms began about 4 days ago, initially with cough.  On subsequent, the patient has developed chills, pain in the left lower extremity, redness. No relief with OTC medication, including NyQuil.  No recent medication changes, diet changes, activity changes. No nausea, vomiting, abdominal pain, diarrhea.  Past Medical History  Diagnosis Date  . Asthma    Past Surgical History  Procedure Laterality Date  . Tubal ligation     No family history on file. Social History  Substance Use Topics  . Smoking status: Former Games developer  . Smokeless tobacco: Never Used  . Alcohol Use: Yes     Comment: ocassionally   OB History    Gravida Para Term Preterm AB TAB SAB Ectopic Multiple Living   Review of Systems  Constitutional:       Per HPI, otherwise negative  HENT:       Per HPI, otherwise negative  Respiratory:       Per HPI, otherwise negative  Cardiovascular:       Per HPI, otherwise negative  Gastrointestinal: Negative for vomiting.  Endocrine:       Negative aside from HPI  Genitourinary:       Neg aside from HPI   Musculoskeletal:       Per HPI, otherwise negative  Skin: Positive for color change.  Neurological: Negative for syncope.      Allergies  Review of patient's allergies indicates no known allergies.  Home Medications   Prior to Admission medications   Medication Sig Start Date End Date Taking? Authorizing Provider  ibuprofen (ADVIL,MOTRIN) 200 MG tablet Take 800 mg by mouth every 6 (six) hours as needed for  headache, moderate pain or cramping.    Yes Historical Provider, MD  Multiple Vitamin (MULTIVITAMIN WITH MINERALS) TABS tablet Take 1 tablet by mouth daily.   Yes Historical Provider, MD   BP 148/92 mmHg  Pulse 111  Temp(Src) 102.6 F (39.2 C) (Oral)  Resp 16  Ht  (1.676 m)  Wt 228 lb (103.42 kg)  BMI 36.82 kg/m2  SpO2 100%  LMP 06/07/2016 Physical Exam  Constitutional: She is oriented to person, place, and time. She appears well-developed and well-nourished. No distress.  HENT:  Head: Normocephalic and atraumatic.  Eyes: Conjunctivae and EOM are normal.  Cardiovascular: Regular rhythm.  Tachycardia present.   Pulmonary/Chest: Effort normal. No stridor. She has decreased breath sounds.  Abdominal: She exhibits no distension.  Musculoskeletal: She exhibits edema and tenderness.       Legs: Neurological: She is alert and oriented to person, place, and time. No cranial nerve deficit.  Skin: Skin is warm.  See musculoskeletal exam  Psychiatric: She has a normal mood and affect.  Nursing note and vitals reviewed.   ED Course  Procedures (including critical care time) Labs Review Labs Reviewed  CBC - Abnormal; Notable for the following:    Hemoglobin 11.7 (*)    All other components within normal limits  BASIC METABOLIC PANEL - Abnormal;  Notable for the following:    Sodium 134 (*)    Glucose, Bld 124 (*)    All other components within normal limits  URINALYSIS, ROUTINE W REFLEX MICROSCOPIC (NOT [facility]) - Abnormal; Notable for the following:    APPearance CLOUDY (*)    Specific Gravity, Urine 1.034 (*)    Protein, ur 100 (*)    All other components within normal limits  URINE MICROSCOPIC-ADD ON - Abnormal; Notable for the following:    Squamous Epithelial / LPF 0-5 (*)    Bacteria, UA MANY (*)    All other components within normal limits  LACTIC ACID, PLASMA  LACTIC ACID, PLASMA  I-STAT BETA HCG BLOOD, ED ([facility], [facility], [facility] ONLY)    Imaging Review Dg Chest 2  View  06/26/2016  CLINICAL DATA:  Chest pain.  Edema.  Dyspnea. EXAM: CHEST  2 VIEW COMPARISON:  05/01/2015 chest radiograph. FINDINGS: Stable cardiomediastinal silhouette with normal heart size. No pneumothorax. No pleural effusion. Stable vague prominence of the interstitial markings in both lungs. No consolidative airspace disease. IMPRESSION: No consolidative airspace disease to suggest a pneumonia. Stable vague prominence of the interstitial markings in both lungs. An underlying interstitial lung disease such as due to sarcoidosis cannot be excluded. Consider further evaluation with a high-resolution chest CT study on a short term outpatient basis as clinically warranted. Electronically Signed   By: Delbert PhenixJason A Poff M.D.   On: 06/26/2016 12:28    I also discussed the ultrasound results with the ultrasound technician, no DVT. I have personally reviewed and evaluated these images and lab results as part of my medical decision-making.   EKG Interpretation   Date/Time:  Sunday June 26 2016 11:21:07 EDT Ventricular Rate:  106 PR Interval:    QRS Duration: 78 QT Interval:  305 QTC Calculation: 405 R Axis:   82 Text Interpretation:  Sinus tachycardia Anterior infarct, old Minimal ST  depression, lateral leads Abnormal ekg Confirmed by Gerhard MunchLOCKWOOD, Navdeep Halt  MD  [phone]([id number]) on 06/26/2016 11:29:47 AM     Patient's physical exam is notable for obvious cellulitis of the left lower extremity. With fever, tachycardia, there is some suspicion for SIRS. Patient received fluid antibiotics and Tylenol as well as analgesia after the initial evaluation.  Update: Patient states that her pain has diminished, she remains tachycardic, the fever has improved.  MDM  Patient presents with concern of ongoing left lower extremity pain, generalized discomfort, fever, chills. Here the patient is awake and alert, interacting appropriately. However, the patient has multiple SIRS criteria, and with cellulitis, or some suspicion  for bacteremia. However, the patient's fever improves after initial antibiotics, fluids. Given concern for cellulitis, systemic inflammatory response syndrome, patient required admission for further evaluation and management.  Gerhard Munchobert Quanika Solem, MD 06/26/16 [phone]

## 2016-06-26 NOTE — ED Notes (Addendum)
Patient gone to CT, will attempt to draw labs at a later time

## 2016-06-26 NOTE — ED Notes (Signed)
Patient transported to X-ray 

## 2016-06-26 NOTE — Progress Notes (Signed)
Pharmacy Antibiotic Note  Brittany Beasley is a 50 y.o. female with PMHx asthma, admitted on 06/26/2016 with cough, dyspnea, and new LLE swelling, redness and pain.  CXR negative for pneumonia, however underlying ILD cannot be excluded.  LE U/S in process.  Pharmacy has been consulted for Vancomycin and Zosyn dosing for both cellulitis and PNA - confirmed indications with Dr. Sunnie Nielsenegalado who wants to cover for ILD due to pt [facility].  No allergies noted.   CrCl ~100 ml/min (CG) / N Tm 102.6 Wt > 100kg  Plan: Vancomycin 1g IV q8h Zosyn 3.375g IV q8h (infuse over 4 hours) F/u renal function, cultures, VT at Css, clinical course   Height: 5\' 6"  (167.6 cm) Weight: 228 lb (103.42 kg) IBW/kg (Calculated) : 59.3  Temp (24hrs), Avg:100.8 F (38.2 C), Min:98.9 F (37.2 C), Max:102.6 F (39.2 C)   Recent Labs Lab 06/26/16 1150  WBC 9.6  CREATININE 0.56    Estimated Creatinine Clearance: 104.4 mL/min (by C-G formula based on Cr of 0.56).    No Known Allergies  Antimicrobials this admission: 7/9 Vancomcyin >>  7/9 Zosyn >>   Dose adjustments this admission:   Microbiology results: 7/9 BCx: ordered 7/9 Sputum: ordered   Thank you for allowing pharmacy to be a part of this patient's care.  Haynes Hoehnolleen Maurizio Geno, PharmD, BCPS 06/26/2016, 3:25 PM  Pager: [phone]

## 2016-06-27 DIAGNOSIS — A419 Sepsis, unspecified organism: Principal | ICD-10-CM

## 2016-06-27 DIAGNOSIS — L03116 Cellulitis of left lower limb: Secondary | ICD-10-CM

## 2016-06-27 DIAGNOSIS — E876 Hypokalemia: Secondary | ICD-10-CM

## 2016-06-27 LAB — CBC
HCT: 29 % — ABNORMAL LOW (ref 36.0–46.0)
Hemoglobin: 9.7 g/dL — ABNORMAL LOW (ref 12.0–15.0)
MCH: 30.3 pg (ref 26.0–34.0)
MCHC: 33.4 g/dL (ref 30.0–36.0)
MCV: 90.6 fL (ref 78.0–100.0)
PLATELETS: 179 10*3/uL (ref 150–400)
RBC: 3.2 MIL/uL — ABNORMAL LOW (ref 3.87–5.11)
RDW: 14.1 % (ref 11.5–15.5)
WBC: 8.2 10*3/uL (ref 4.0–10.5)

## 2016-06-27 LAB — COMPREHENSIVE METABOLIC PANEL
ALT: 19 U/L (ref 14–54)
ANION GAP: 8 (ref 5–15)
AST: 15 U/L (ref 15–41)
Albumin: 2.7 g/dL — ABNORMAL LOW (ref 3.5–5.0)
Alkaline Phosphatase: 52 U/L (ref 38–126)
BUN: 9 mg/dL (ref 6–20)
CALCIUM: 8 mg/dL — AB (ref 8.9–10.3)
CHLORIDE: 101 mmol/L (ref 101–111)
CO2: 24 mmol/L (ref 22–32)
CREATININE: 0.44 mg/dL (ref 0.44–1.00)
Glucose, Bld: 128 mg/dL — ABNORMAL HIGH (ref 65–99)
Potassium: 3.1 mmol/L — ABNORMAL LOW (ref 3.5–5.1)
SODIUM: 133 mmol/L — AB (ref 135–145)
Total Bilirubin: 0.6 mg/dL (ref 0.3–1.2)
Total Protein: 6.9 g/dL (ref 6.5–8.1)

## 2016-06-27 MED ORDER — POTASSIUM CHLORIDE CRYS ER 20 MEQ PO TBCR
40.0000 meq | EXTENDED_RELEASE_TABLET | Freq: Once | ORAL | Status: AC
  Administered 2016-06-27: 40 meq via ORAL
  Filled 2016-06-27: qty 2

## 2016-06-27 MED ORDER — ZOLPIDEM TARTRATE 5 MG PO TABS
5.0000 mg | ORAL_TABLET | Freq: Every evening | ORAL | Status: DC | PRN
  Administered 2016-06-27: 5 mg via ORAL
  Filled 2016-06-27: qty 1

## 2016-06-27 MED ORDER — ENOXAPARIN SODIUM 40 MG/0.4ML ~~LOC~~ SOLN
40.0000 mg | SUBCUTANEOUS | Status: DC
Start: 2016-06-27 — End: 2016-06-28
  Administered 2016-06-27: 40 mg via SUBCUTANEOUS
  Filled 2016-06-27: qty 0.4

## 2016-06-27 NOTE — Progress Notes (Signed)
PROGRESS NOTE    Brittany Beasley  YNW:[id number] DOB: 06/06/1966 DOA: 06/26/2016 PCP: Default, Provider, MD    Brief Narrative:   50 y.o. female with medical history significant of asthma who presents complaining of dry cough, fever, since 4 days prior to admission. She also notice today left lower extremity swelling, redness. She has not been able to ambulate due to severe swelling. She also report mild headaches, frontal area. No focal deficit  Assessment & Plan:   Active Problems: Sepsis - Patient was reported to have service but definition of sepsis is sirs with active infection such will title appropriately to sepsis. We'll continue current antibiotic regimen. - Follow-up with blood cultures    Cellulitis -Continue Zosyn and vancomycin    PNA (pneumonia) - Improving on current antibiotic regimen - Frankly low index of suspicion for active pneumonia. Suspect patient may have some underlying pulmonary disease which she will need to follow-up with pulmonary. As outpatient  Hypokalemia - We'll replace and reassess  DVT prophylaxis: Lovenox Code Status: Full Family Communication: d/c patient directly Disposition Plan: Pending improvement in condition   Consultants:   None   Procedures: None   Antimicrobials: Vancomycin and Zosyn   Subjective: Patient has no new complaints. No acute issues overnight. Left lower extremity still uncomfortable, improved with pain medication regimen  Objective: Filed Vitals:   06/26/16 2312 06/27/16 0041 06/27/16 0606 06/27/16 1500  BP:   114/57 121/90  Pulse:   85 88  Temp: 102.7 F (39.3 C) 100.8 F (38.2 C) 98.5 F (36.9 C) 101.2 F (38.4 C)  TempSrc: Oral Oral Oral Oral  Resp:   20 20  Height:      Weight:      SpO2:   100% 100%    Intake/Output Summary (Last 24 hours) at 06/27/16 1641 Last data filed at 06/27/16 1453  Gross per 24 hour  Intake   2895 ml  Output      0 ml  Net   2895 ml   Filed Weights   06/26/16  1040 06/26/16 1623  Weight: 103.42 kg (228 lb) 104.8 kg (231 lb 0.7 oz)    Examination:  General exam: Appears calm and comfortable  Respiratory system: Clear to auscultation. Respiratory effort normal. Cardiovascular system: S1 & S2 heard, RRR. No JVD, murmurs, rubs, gallops or clicks. No pedal edema. Gastrointestinal system: Abdomen is nondistended, soft and nontender. No organomegaly or masses felt. Normal bowel sounds heard. Central nervous system: Alert and oriented. No focal neurological deficits. Extremities: Symmetric 5 x 5 power. Skin: Erythema left lower extremity pain on palpation and calorie when compared to right lower extremity Psychiatry: Judgement and insight appear normal. Mood & affect appropriate.     Data Reviewed: I have personally reviewed following labs and imaging studies  CBC:  Recent Labs Lab 06/26/16 1150 06/27/16 0432  WBC 9.6 8.2  HGB 11.7* 9.7*  HCT 36.2 29.0*  MCV 91.2 90.6  PLT 162 179   Basic Metabolic Panel:  Recent Labs Lab 06/26/16 1150 06/27/16 0432  NA 134* 133*  K 3.9 3.1*  CL 102 101  CO2 23 24  GLUCOSE 124* 128*  BUN 11 9  CREATININE 0.56 0.44  CALCIUM 8.9 8.0*   GFR: Estimated Creatinine Clearance: 105.2 mL/min (by C-G formula based on Cr of 0.44). Liver Function Tests:  Recent Labs Lab 06/27/16 0432  AST 15  ALT 19  ALKPHOS 52  BILITOT 0.6  PROT 6.9  ALBUMIN 2.7*   No results  for input(s): LIPASE, AMYLASE in the last 168 hours. No results for input(s): AMMONIA in the last 168 hours. Coagulation Profile: No results for input(s): INR, PROTIME in the last 168 hours. Cardiac Enzymes: No results for input(s): CKTOTAL, CKMB, CKMBINDEX, TROPONINI in the last 168 hours. BNP (last 3 results) No results for input(s): PROBNP in the last 8760 hours. HbA1C: No results for input(s): HGBA1C in the last 72 hours. CBG: No results for input(s): GLUCAP in the last 168 hours. Lipid Profile: No results for input(s): CHOL,  HDL, LDLCALC, TRIG, CHOLHDL, LDLDIRECT in the last 72 hours. Thyroid Function Tests: No results for input(s): TSH, T4TOTAL, FREET4, T3FREE, THYROIDAB in the last 72 hours. Anemia Panel: No results for input(s): VITAMINB12, FOLATE, FERRITIN, TIBC, IRON, RETICCTPCT in the last 72 hours. Sepsis Labs:  Recent Labs Lab 06/26/16 1640 06/26/16 1939  LATICACIDVEN 1.7 1.9    Recent Results (from the past 240 hour(s))  Culture, blood (routine x 2)     Status: None (Preliminary result)   Collection Time: 06/26/16 12:00 PM  Result Value Ref Range Status   Specimen Description BLOOD LEFT ARM  6 ML IN [facility] BOTTLE  Final   Special Requests Normal  Final   Culture   Final    NO GROWTH < 24 HOURS Performed at [facility]    Report Status PENDING  Incomplete  Culture, blood (routine x 2)     Status: None (Preliminary result)   Collection Time: 06/26/16  4:40 PM  Result Value Ref Range Status   Specimen Description BLOOD RIGHT ARM  Final   Special Requests BOTTLES DRAWN AEROBIC AND ANAEROBIC 5CC EA  Final   Culture PENDING  Incomplete   Report Status PENDING  Incomplete         Radiology Studies: Dg Chest 2 View  06/26/2016  CLINICAL DATA:  Chest pain.  Edema.  Dyspnea. EXAM: CHEST  2 VIEW COMPARISON:  05/01/2015 chest radiograph. FINDINGS: Stable cardiomediastinal silhouette with normal heart size. No pneumothorax. No pleural effusion. Stable vague prominence of the interstitial markings in both lungs. No consolidative airspace disease. IMPRESSION: No consolidative airspace disease to suggest a pneumonia. Stable vague prominence of the interstitial markings in both lungs. An underlying interstitial lung disease such as due to sarcoidosis cannot be excluded. Consider further evaluation with a high-resolution chest CT study on a short term outpatient basis as clinically warranted. Electronically Signed   By: Delbert Phenix M.D.   On: 06/26/2016 12:28   Ct Chest High Resolution  06/26/2016   CLINICAL DATA:  50 year old female with chronic shortness of breath and leg swelling. Evaluate for interstitial lung disease. EXAM: CT CHEST WITHOUT CONTRAST TECHNIQUE: Multidetector CT imaging of the chest was performed following the standard protocol without intravenous contrast. High resolution imaging of the lungs, as well as inspiratory and expiratory imaging, was performed. COMPARISON:  Chest CT 05/01/2015. FINDINGS: Comment: Study is significantly limited by a large amount of patient respiratory motion. Mediastinum/Lymph Nodes: Heart size is normal. There is no significant pericardial fluid, thickening or pericardial calcification. No pathologically enlarged mediastinal or hilar lymph nodes. Please note that accurate exclusion of hilar adenopathy is limited on noncontrast CT scans. Esophagus is unremarkable in appearance. No axillary lymphadenopathy. Lungs/Pleura: Although there is considerable respiratory motion which severely limits today's examination, high-resolution images demonstrate no significant regions of ground-glass attenuation, subpleural reticulation, parenchymal banding, traction bronchiectasis or frank honeycombing to indicate interstitial lung disease. There is diffuse bronchial wall thickening, and there are patchy areas  of thickening of the peribronchovascular interstitium with some associated peribronchovascular micronodularity, most evident in the left upper lobe; overall, this is very similar to prior study 05/01/2015, although slightly improved. Inspiratory and expiratory imaging demonstrates moderate air trapping, indicative of small airways disease. No confluent consolidative airspace disease. No pleural effusions. Upper abdomen: Unremarkable. Musculoskeletal: There are no aggressive appearing lytic or blastic lesions noted in the visualized portions of the skeleton. IMPRESSION: 1. No evidence of interstitial lung disease. 2. There is a spectrum of findings in the lungs which remains  concerning for a chronic indolent atypical infectious process, such as MAI (mycobacterium avium intracellulare), however, findings have slightly improved compared to prior study 05/01/2015. 3. Moderate air trapping, indicative of small airways disease. Electronically Signed   By: Trudie Reedaniel  Entrikin M.D.   On: 06/26/2016 16:08        Scheduled Meds: . docusate sodium  100 mg Oral BID  . enoxaparin (LOVENOX) injection  40 mg Subcutaneous Q24H  . guaiFENesin  600 mg Oral BID  . multivitamin with minerals  1 tablet Oral Daily  . piperacillin-tazobactam (ZOSYN)  IV  3.375 g Intravenous Q8H  . vancomycin  1,000 mg Intravenous Q8H   Continuous Infusions: . sodium chloride Stopped (06/26/16 1835)  . sodium chloride 100 mL/hr at 06/27/16 1614     LOS: 1 day    Time spent: > 35    Penny PiaVEGA, Sapna Padron, MD [facility] Pager [phone]  If 7PM-7AM, please contact night-coverage [email or web address] Password [email or web address] 06/27/2016, 4:41 PM

## 2016-06-27 NOTE — Care Management Note (Signed)
Case Management Note  Patient Details  Name: Brittany Beasley MRN: [id number] Date of Birth: 07/25/1966  Subjective/Objective: 50 y/o f admitted w/L leg cellulitis. From home.                   Action/Plan:d/c plan home.   Expected Discharge Date:                  Expected Discharge Plan:  Home/Self Care  In-House Referral:     Discharge planning Services  CM Consult  Post Acute Care Choice:    Choice offered to:     DME Arranged:    DME Agency:     HH Arranged:    HH Agency:     Status of Service:  In process, will continue to follow  If discussed at Long Length of Stay Meetings, dates discussed:    Additional Comments:  Lanier ClamMahabir, Kiwana Deblasi, RN 06/27/2016, 3:48 PM

## 2016-06-28 DIAGNOSIS — L0390 Cellulitis, unspecified: Secondary | ICD-10-CM

## 2016-06-28 LAB — BASIC METABOLIC PANEL
Anion gap: 6 (ref 5–15)
BUN: 6 mg/dL (ref 6–20)
CHLORIDE: 101 mmol/L (ref 101–111)
CO2: 26 mmol/L (ref 22–32)
CREATININE: 0.53 mg/dL (ref 0.44–1.00)
Calcium: 8.1 mg/dL — ABNORMAL LOW (ref 8.9–10.3)
GFR calc Af Amer: >60 mL/min (ref >60)
GFR calc non Af Amer: >60 mL/min (ref >60)
Glucose, Bld: 113 mg/dL — ABNORMAL HIGH (ref 65–99)
Potassium: 3.5 mmol/L (ref 3.5–5.1)
SODIUM: 133 mmol/L — AB (ref 135–145)

## 2016-06-28 LAB — VANCOMYCIN, TROUGH: Vancomycin Tr: 5 ug/mL — ABNORMAL LOW (ref 15–20)

## 2016-06-28 LAB — HIV ANTIBODY (ROUTINE TESTING W REFLEX): HIV Screen 4th Generation wRfx: NONREACTIVE

## 2016-06-28 MED ORDER — AMOXICILLIN-POT CLAVULANATE 875-125 MG PO TABS: 1.0000 | ORAL_TABLET | Freq: Two times a day (BID) | ORAL | Status: DC

## 2016-06-28 MED ORDER — VANCOMYCIN HCL 10 G IV SOLR
1250.0000 mg | Freq: Three times a day (TID) | INTRAVENOUS | Status: DC
  Administered 2016-06-28: 1250 mg via INTRAVENOUS
  Filled 2016-06-28 (×2): qty 1250

## 2016-06-28 MED ORDER — HYDROCODONE-ACETAMINOPHEN 5-325 MG PO TABS: 1.0000 | ORAL_TABLET | ORAL | Status: DC | PRN

## 2016-06-28 MED ORDER — LORAZEPAM 0.5 MG PO TABS
0.5000 mg | ORAL_TABLET | Freq: Once | ORAL | Status: AC
  Administered 2016-06-28: 0.5 mg via ORAL
  Filled 2016-06-28: qty 1

## 2016-06-28 MED ORDER — DOXYCYCLINE HYCLATE 100 MG PO TABS: 100.0000 mg | ORAL_TABLET | Freq: Two times a day (BID) | ORAL | Status: DC

## 2016-06-28 NOTE — Progress Notes (Signed)
Pharmacy Antibiotic Note  Brittany Beasley is a 50 y.o. female with PMHx asthma, admitted on 06/26/2016 with cough, dyspnea, and new LLE swelling, redness and pain.  CXR negative for pneumonia, however underlying ILD cannot be excluded.  LE U/S in process.  Pharmacy has been consulted for Vancomycin and Zosyn dosing for both cellulitis and PNA - confirmed indications with Dr. Sunnie Nielsenegalado who wants to cover for ILD due to [facility].  No allergies noted.    Plan: Vancomycin increase to 1250 mg IV q8h Zosyn 3.375g IV q8h (infuse over 4 hours) F/u renal function, cultures, VT at Css, clinical course  Height: 5\' 6"  (167.6 cm) Weight: 231 lb 0.7 oz (104.8 kg) IBW/kg (Calculated) : 59.3  Temp (24hrs), Avg:100.5 F (38.1 C), Min:99.2 F (37.3 C), Max:101.9 F (38.8 C)   Recent Labs Lab 06/26/16 1150 06/26/16 1640 06/26/16 1939 06/27/16 0432 06/28/16 0512 06/28/16 1126  WBC 9.6  --   --  8.2  --   --   CREATININE 0.56  --   --  0.44 0.53  --   LATICACIDVEN  --  1.7 1.9  --   --   --   VANCOTROUGH  --   --   --   --   --  5*    Estimated Creatinine Clearance: 105.2 mL/min (by C-G formula based on Cr of 0.53).    No Known Allergies  Antimicrobials this admission: 7/9 Vancomcyin >>  7/9 Zosyn >>   Dose adjustments this admission: 7/11 VT at 11:30 = 5 on 1g q8h  Microbiology results: 7/9 BCx: ordered 7/9 Sputum: ordered   Thank you for allowing pharmacy to be a part of this patient's care.  Otho BellowsGreen, Cadell Gabrielson L PharmD Pager [phone] 06/28/2016, 12:33 PM

## 2016-06-28 NOTE — Discharge Summary (Signed)
Physician Discharge Summary  Brittany Beasley ZOX:[id number] DOB: 09-14-1966 DOA: 06/26/2016  PCP: Default, Provider, MD  Admit date: 06/26/2016 Discharge date: 06/28/2016  Time spent: > 35 minutes  Recommendations for Outpatient Follow-up:  1. Monitor and Decide whether or not to prolong antibiotic coverage. We'll discharge on 7 more days of antibiotics to complete a 9 day treatment course. 2. Follow-up with blood cultures as patient wanted to be discharged on 06/28/2016   Discharge Diagnoses:  Active Problems:   SIRS (systemic inflammatory response syndrome) (HCC)   Cellulitis   PNA (pneumonia)   Discharge Condition: stable  Diet recommendation: regular diet  Filed Weights   06/26/16 1040 06/26/16 1623  Weight: 103.42 kg (228 lb) 104.8 kg (231 lb 0.7 oz)    History of present illness:  50 y.o. female with medical history significant of asthma who presents complaining of dry cough, fever, since 4 days prior to admission. She also notice today left lower extremity swelling, redness. She has not been able to ambulate due to severe swelling. She also report mild headaches, frontal area. No focal deficit  Hospital Course:  Active Problems: Sepsis -Blood cultures negative 1 day. Patient requesting discharge despite active fevers. Her vital signs are stable and she does not have leukocytosis. I have indicated that I will discharge her on oral antibiotics but she is to come back to the hospital should fevers persist or her condition worsen. Patient verbalizes agreement and understanding   Cellulitis -Improved on Zosyn and vancomycin - As mentioned above patient still spiking fevers but requested discharge. We'll discharge her on Augmentin and doxycycline   PNA (pneumonia) - Improving on current antibiotic regimen - Frankly low index of suspicion for active pneumonia. Suspect patient may have some underlying pulmonary disease which she will need to follow-up with pulmonary as  outpatient.  Hypokalemia - We'll replace and reassess  Procedures:  None  Consultations:  None  Discharge Exam: Filed Vitals:   06/28/16 0502 06/28/16 1513  BP: 127/74 113/55  Pulse: 93 85  Temp: 100.1 F (37.8 C) 98.4 F (36.9 C)  Resp: 18 18    General: Pt in nad, alert and awake Cardiovascular: rrr, no rubs Respiratory: no increased wob, no wheezes  Discharge Instructions   Discharge Instructions    Call MD for:  severe uncontrolled pain    Complete by:  As directed      Call MD for:  temperature >100.4    Complete by:  As directed      Diet - low sodium heart healthy    Complete by:  As directed      Discharge instructions    Complete by:  As directed   Please follow-up with your primary care physician in the next one or 2 weeks or sooner should any new concerns arise.     Increase activity slowly    Complete by:  As directed           Current Discharge Medication List    START taking these medications   Details  amoxicillin-clavulanate (AUGMENTIN) 875-125 MG tablet Take 1 tablet by mouth 2 (two) times daily. Qty: 14 tablet, Refills: 0    doxycycline (VIBRA-TABS) 100 MG tablet Take 1 tablet (100 mg total) by mouth 2 (two) times daily. Qty: 14 tablet, Refills: 0    HYDROcodone-acetaminophen (NORCO/VICODIN) 5-325 MG tablet Take 1-2 tablets by mouth every 4 (four) hours as needed for moderate pain. Qty: 30 tablet, Refills: 0      CONTINUE these  medications which have NOT CHANGED   Details  ibuprofen (ADVIL,MOTRIN) 200 MG tablet Take 800 mg by mouth every 6 (six) hours as needed for headache, moderate pain or cramping.     Multiple Vitamin (MULTIVITAMIN WITH MINERALS) TABS tablet Take 1 tablet by mouth daily.       No Known Allergies    The results of significant diagnostics from this hospitalization (including imaging, microbiology, ancillary and laboratory) are listed below for reference.    Significant Diagnostic Studies: Dg Chest 2  View  06/26/2016  CLINICAL DATA:  Chest pain.  Edema.  Dyspnea. EXAM: CHEST  2 VIEW COMPARISON:  05/01/2015 chest radiograph. FINDINGS: Stable cardiomediastinal silhouette with normal heart size. No pneumothorax. No pleural effusion. Stable vague prominence of the interstitial markings in both lungs. No consolidative airspace disease. IMPRESSION: No consolidative airspace disease to suggest a pneumonia. Stable vague prominence of the interstitial markings in both lungs. An underlying interstitial lung disease such as due to sarcoidosis cannot be excluded. Consider further evaluation with a high-resolution chest CT study on a short term outpatient basis as clinically warranted. Electronically Signed   By: Delbert PhenixJason A Poff M.D.   On: 06/26/2016 12:28   Ct Chest High Resolution  06/26/2016  CLINICAL DATA:  50 year old female with chronic shortness of breath and leg swelling. Evaluate for interstitial lung disease. EXAM: CT CHEST WITHOUT CONTRAST TECHNIQUE: Multidetector CT imaging of the chest was performed following the standard protocol without intravenous contrast. High resolution imaging of the lungs, as well as inspiratory and expiratory imaging, was performed. COMPARISON:  Chest CT 05/01/2015. FINDINGS: Comment: Study is significantly limited by a large amount of patient respiratory motion. Mediastinum/Lymph Nodes: Heart size is normal. There is no significant pericardial fluid, thickening or pericardial calcification. No pathologically enlarged mediastinal or hilar lymph nodes. Please note that accurate exclusion of hilar adenopathy is limited on noncontrast CT scans. Esophagus is unremarkable in appearance. No axillary lymphadenopathy. Lungs/Pleura: Although there is considerable respiratory motion which severely limits today's examination, high-resolution images demonstrate no significant regions of ground-glass attenuation, subpleural reticulation, parenchymal banding, traction bronchiectasis or frank  honeycombing to indicate interstitial lung disease. There is diffuse bronchial wall thickening, and there are patchy areas of thickening of the peribronchovascular interstitium with some associated peribronchovascular micronodularity, most evident in the left upper lobe; overall, this is very similar to prior study 05/01/2015, although slightly improved. Inspiratory and expiratory imaging demonstrates moderate air trapping, indicative of small airways disease. No confluent consolidative airspace disease. No pleural effusions. Upper abdomen: Unremarkable. Musculoskeletal: There are no aggressive appearing lytic or blastic lesions noted in the visualized portions of the skeleton. IMPRESSION: 1. No evidence of interstitial lung disease. 2. There is a spectrum of findings in the lungs which remains concerning for a chronic indolent atypical infectious process, such as MAI (mycobacterium avium intracellulare), however, findings have slightly improved compared to prior study 05/01/2015. 3. Moderate air trapping, indicative of small airways disease. Electronically Signed   By: Trudie Reedaniel  Entrikin M.D.   On: 06/26/2016 16:08    Microbiology: Recent Results (from the past 240 hour(s))  Culture, blood (routine x 2)     Status: None (Preliminary result)   Collection Time: 06/26/16 12:00 PM  Result Value Ref Range Status   Specimen Description BLOOD LEFT ARM  [facility]  Final   Special Requests Normal  Final   Culture   Final    NO GROWTH 2 DAYS Performed at [facility]    Report Status  PENDING  Incomplete  Culture, blood (routine x 2)     Status: None (Preliminary result)   Collection Time: 06/26/16  4:40 PM  Result Value Ref Range Status   Specimen Description BLOOD RIGHT ARM  Final   Special Requests BOTTLES DRAWN AEROBIC AND ANAEROBIC 5CC EA  Final   Culture   Final    NO GROWTH 1 DAY Performed at [facility]    Report Status PENDING  Incomplete     Labs: Basic Metabolic  Panel:  Recent Labs Lab 06/26/16 1150 06/27/16 0432 06/28/16 0512  NA 134* 133* 133*  K 3.9 3.1* 3.5  CL 102 101 101  CO2 23 24 26   GLUCOSE 124* 128* 113*  BUN 11 9 6   CREATININE 0.56 0.44 0.53  CALCIUM 8.9 8.0* 8.1*   Liver Function Tests:  Recent Labs Lab 06/27/16 0432  AST 15  ALT 19  ALKPHOS 52  BILITOT 0.6  PROT 6.9  ALBUMIN 2.7*   No results for input(s): LIPASE, AMYLASE in the last 168 hours. No results for input(s): AMMONIA in the last 168 hours. CBC:  Recent Labs Lab 06/26/16 1150 06/27/16 0432  WBC 9.6 8.2  HGB 11.7* 9.7*  HCT 36.2 29.0*  MCV 91.2 90.6  PLT 162 179   Cardiac Enzymes: No results for input(s): CKTOTAL, CKMB, CKMBINDEX, TROPONINI in the last 168 hours. BNP: BNP (last 3 results) No results for input(s): BNP in the last 8760 hours.  ProBNP (last 3 results) No results for input(s): PROBNP in the last 8760 hours.  CBG: No results for input(s): GLUCAP in the last 168 hours.   Signed:  Penny Pia MD.  [facility] 06/28/2016, 4:28 PM

## 2016-07-01 LAB — CULTURE, BLOOD (ROUTINE X 2)
CULTURE: NO GROWTH
SPECIAL REQUESTS: NORMAL

## 2016-07-02 LAB — CULTURE, BLOOD (ROUTINE X 2): CULTURE: NO GROWTH

## 2016-07-08 ENCOUNTER — Encounter (HOSPITAL_COMMUNITY): Payer: Self-pay | Admitting: Emergency Medicine

## 2016-07-08 ENCOUNTER — Emergency Department (HOSPITAL_COMMUNITY)
Admission: EM | Admit: 2016-07-08 | Discharge: 2016-07-08 | Disposition: A | Discharge status: Home / Self Care | Payer: Self-pay | Attending: Emergency Medicine | Admitting: Emergency Medicine

## 2016-07-08 DIAGNOSIS — J45909 Unspecified asthma, uncomplicated: Secondary | ICD-10-CM | POA: Insufficient documentation

## 2016-07-08 DIAGNOSIS — Z79899 Other long term (current) drug therapy: Secondary | ICD-10-CM | POA: Insufficient documentation

## 2016-07-08 DIAGNOSIS — L03116 Cellulitis of left lower limb: Secondary | ICD-10-CM | POA: Insufficient documentation

## 2016-07-08 DIAGNOSIS — Z791 Long term (current) use of non-steroidal anti-inflammatories (NSAID): Secondary | ICD-10-CM | POA: Insufficient documentation

## 2016-07-08 DIAGNOSIS — Z87891 Personal history of nicotine dependence: Secondary | ICD-10-CM | POA: Insufficient documentation

## 2016-07-08 LAB — BASIC METABOLIC PANEL
ANION GAP: 7 (ref 5–15)
BUN: 16 mg/dL (ref 6–20)
CHLORIDE: 111 mmol/L (ref 101–111)
CO2: 22 mmol/L (ref 22–32)
Calcium: 8.5 mg/dL — ABNORMAL LOW (ref 8.9–10.3)
Creatinine, Ser: 0.69 mg/dL (ref 0.44–1.00)
GFR calc Af Amer: >60 mL/min (ref >60)
GLUCOSE: 104 mg/dL — AB (ref 65–99)
POTASSIUM: 4.8 mmol/L (ref 3.5–5.1)
Sodium: 140 mmol/L (ref 135–145)

## 2016-07-08 LAB — CBC WITH DIFFERENTIAL/PLATELET
Basophils Absolute: 0 10*3/uL (ref 0.0–0.1)
Basophils Relative: 0 %
EOS PCT: 2 %
Eosinophils Absolute: 0.1 10*3/uL (ref 0.0–0.7)
HEMATOCRIT: 27.7 % — AB (ref 36.0–46.0)
Hemoglobin: 8.7 g/dL — ABNORMAL LOW (ref 12.0–15.0)
LYMPHS ABS: 1.3 10*3/uL (ref 0.7–4.0)
LYMPHS PCT: 22 %
MCH: 29.6 pg (ref 26.0–34.0)
MCHC: 31.4 g/dL (ref 30.0–36.0)
MCV: 94.2 fL (ref 78.0–100.0)
MONO ABS: 0.6 10*3/uL (ref 0.1–1.0)
MONOS PCT: 9 %
NEUTROS ABS: 3.9 10*3/uL (ref 1.7–7.7)
Neutrophils Relative %: 67 %
Platelets: 358 10*3/uL (ref 150–400)
RBC: 2.94 MIL/uL — ABNORMAL LOW (ref 3.87–5.11)
RDW: 14.8 % (ref 11.5–15.5)
WBC: 5.8 10*3/uL (ref 4.0–10.5)

## 2016-07-08 MED ORDER — CEPHALEXIN 250 MG PO CAPS: 250.0000 mg | ORAL_CAPSULE | Freq: Four times a day (QID) | ORAL | Status: DC

## 2016-07-08 MED ORDER — VANCOMYCIN HCL 10 G IV SOLR
1500.0000 mg | Freq: Once | INTRAVENOUS | Status: AC
  Administered 2016-07-08: 1500 mg via INTRAVENOUS
  Filled 2016-07-08: qty 1500

## 2016-07-08 MED ORDER — VANCOMYCIN HCL IN DEXTROSE 1-5 GM/200ML-% IV SOLN: 1000.0000 mg | Freq: Three times a day (TID) | INTRAVENOUS | Status: DC

## 2016-07-08 MED ORDER — SULFAMETHOXAZOLE-TRIMETHOPRIM 800-160 MG PO TABS: 1.0000 | ORAL_TABLET | Freq: Two times a day (BID) | ORAL | Status: AC

## 2016-07-08 NOTE — Discharge Instructions (Signed)

## 2016-07-08 NOTE — ED Provider Notes (Signed)
CSN: [id number]     Arrival date & time 07/08/16  0057 History  By signing my name below, I, Vista Mink, attest that this documentation has been prepared under the direction and in the presence of [employer].  Electronically Signed: Vista Mink, ED Scribe. 07/08/2016. 2:02 AM.  Chief Complaint  Patient presents with  . Cellulitis    The history is provided by the patient. No language interpreter was used.   HPI Comments: Brittany Beasley is a 50 y.o. female who presents to the Emergency Department complaining of swelling to her left ankle that has gradually worsened over the past week. Pt was seen here at [facility] on 06/26/2016 and diagnosed with cellulitis on her left ankle. Pt was prescribed abx and states she finished them as instructed. Pt applied a home remedy from [employer] with no relief. Pt denies any current cough, fever.    Past Medical History  Diagnosis Date  . Asthma    Past Surgical History  Procedure Laterality Date  . Tubal ligation     History reviewed. No pertinent family history. Social History  Substance Use Topics  . Smoking status: Former Games developer  . Smokeless tobacco: Never Used  . Alcohol Use: Yes     Comment: ocassionally   OB History    Gravida Para Term Preterm AB TAB SAB Ectopic Multiple Living   Review of Systems  Constitutional: Negative for fever.  Respiratory: Negative for cough.   Cardiovascular: Positive for leg swelling (left ankle).  All other systems reviewed and are negative.   Allergies  Review of patient's allergies indicates no known allergies.  Home Medications   Prior to Admission medications   Medication Sig Start Date End Date Taking? Authorizing Provider  amoxicillin-clavulanate (AUGMENTIN) 875-125 MG tablet Take 1 tablet by mouth 2 (two) times daily. 06/28/16   Penny Pia, MD  doxycycline (VIBRA-TABS) 100 MG tablet Take 1 tablet (100 mg total) by mouth 2 (two) times daily. 06/28/16   Penny Pia, MD   HYDROcodone-acetaminophen (NORCO/VICODIN) 5-325 MG tablet Take 1-2 tablets by mouth every 4 (four) hours as needed for moderate pain. 06/28/16   Penny Pia, MD  ibuprofen (ADVIL,MOTRIN) 200 MG tablet Take 800 mg by mouth every 6 (six) hours as needed for headache, moderate pain or cramping.     Historical Provider, MD  Multiple Vitamin (MULTIVITAMIN WITH MINERALS) TABS tablet Take 1 tablet by mouth daily.    Historical Provider, MD   BP 155/85 mmHg  Pulse 91  Temp(Src) 98.6 F (37 C) (Oral)  Resp 18  SpO2 99%  LMP 06/07/2016 Physical Exam  Constitutional: She is oriented to person, place, and time. She appears well-developed and well-nourished.  HENT:  Head: Normocephalic and atraumatic.  Eyes: Conjunctivae and EOM are normal. Pupils are equal, round, and reactive to light.  Neck: Normal range of motion. Neck supple.  Cardiovascular: Normal rate, regular rhythm and intact distal pulses.  Exam reveals no gallop and no friction rub.   No murmur heard. Intact distal pulses  Pulmonary/Chest: Effort normal and breath sounds normal. No respiratory distress. She has no wheezes. She has no rales. She exhibits no tenderness.  Abdominal: Soft. Bowel sounds are normal. She exhibits no distension and no mass. There is no tenderness. There is no rebound and no guarding.  Musculoskeletal: Normal range of motion. She exhibits no edema or tenderness.  Neurological: She is alert and oriented to  person, place, and time.  Skin: Skin is warm and dry.  Significant swelling, edema, and erythema of left lower extremity dominant concerning for recurrent cellulitis  Psychiatric: She has a normal mood and affect. Her behavior is normal. Judgment and thought content normal.  Nursing note and vitals reviewed.   ED Course  Procedures  DIAGNOSTIC STUDIES: Oxygen Saturation is 99% on RA, normal by my interpretation.  COORDINATION OF CARE: 1:26 AM-Will order vancomycin. Discussed treatment plan with pt at  bedside and pt agreed to plan.   Results for orders placed or performed during the hospital encounter of 07/08/16  CBC with Differential/Platelet  Result Value Ref Range   WBC 5.8 4.0 - 10.5 K/uL   RBC 2.94 (L) 3.87 - 5.11 MIL/uL   Hemoglobin 8.7 (L) 12.0 - 15.0 g/dL   HCT 91.427.7 (L) 78.236.0 - 95.646.0 %   MCV 94.2 78.0 - 100.0 fL   MCH 29.6 26.0 - 34.0 pg   MCHC 31.4 30.0 - 36.0 g/dL   RDW 21.314.8 08.611.5 - 57.815.5 %   Platelets 358 150 - 400 K/uL   Neutrophils Relative % 67 %   Neutro Abs 3.9 1.7 - 7.7 K/uL   Lymphocytes Relative 22 %   Lymphs Abs 1.3 0.7 - 4.0 K/uL   Monocytes Relative 9 %   Monocytes Absolute 0.6 0.1 - 1.0 K/uL   Eosinophils Relative 2 %   Eosinophils Absolute 0.1 0.0 - 0.7 K/uL   Basophils Relative 0 %   Basophils Absolute 0.0 0.0 - 0.1 K/uL  Basic metabolic panel  Result Value Ref Range   Sodium 140 135 - 145 mmol/L   Potassium 4.8 3.5 - 5.1 mmol/L   Chloride 111 101 - 111 mmol/L   CO2 22 22 - 32 mmol/L   Glucose, Bld 104 (H) 65 - 99 mg/dL   BUN 16 6 - 20 mg/dL   Creatinine, Ser 4.690.69 0.44 - 1.00 mg/dL   Calcium 8.5 (L) 8.9 - 10.3 mg/dL   GFR calc non Af Amer >60 >60 mL/min   GFR calc Af Amer >60 >60 mL/min   Anion gap 7 5 - 15        MDM   Final diagnoses:  Cellulitis of left lower extremity   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 She was recently admitted for the same.  Seen by and discussed with Dr. Adela LankFloyd.   Patient states that she will not stay in the hospital. She is agreeable to receiving a dose of IV antibiotics here now, and will be discharged with antibiotics per home. She will be signed out AGAINST MEDICAL ADVICE, as both myself and Dr. Adela LankFloyd agree that she needs to be admitted for this recurrent cellulitis.   Patients that they would like to leave.  I have discussed my concerns with the patient about them leaving without completing the evaluation.   Patient has capacity to make own medical decisions.  Patient presents with leg  swelling.  Symptoms include: Leg swelling, pain  Concern for: cellulitis  Study limitations and other tests offered include: N/a  Treatment and recommended follow-up include: Observation and IV abx in hospital.  I do not feel that the patient should leave prior to completing their workup. I have discussed the above symptoms, initial findings, study limitations, and treatment plan with the patient. Patient is not altered, does not have any distracting issues, and has capacity to make decisions for themselves. Patient places themselves at risk of worsening infection, sepsis, necrosis, worsening symptoms, disability,  morbidity and/or death.  I personally performed the services described in this documentation, which was scribed in my presence. The recorded information has been reviewed and is accurate.       Roxy Horseman, PA-C 07/08/16 0232  Melene Plan, DO 07/08/16 1610

## 2016-07-08 NOTE — ED Notes (Addendum)
Pt states she was admitted for cellulitis on her L ankle on 7/9 but the leg is now extremely edematous and firm to the touch. Pt denies draining. She has finished the antibiotics that were prescribed. Alert and oriented.

## 2016-08-07 ENCOUNTER — Emergency Department (HOSPITAL_COMMUNITY)
Admission: EM | Admit: 2016-08-07 | Discharge: 2016-08-07 | Disposition: A | Discharge status: Home / Self Care | Payer: Self-pay | Attending: Emergency Medicine | Admitting: Emergency Medicine

## 2016-08-07 ENCOUNTER — Encounter (HOSPITAL_COMMUNITY): Payer: Self-pay | Admitting: Emergency Medicine

## 2016-08-07 DIAGNOSIS — Z87891 Personal history of nicotine dependence: Secondary | ICD-10-CM | POA: Insufficient documentation

## 2016-08-07 DIAGNOSIS — I890 Lymphedema, not elsewhere classified: Secondary | ICD-10-CM | POA: Insufficient documentation

## 2016-08-07 DIAGNOSIS — Z791 Long term (current) use of non-steroidal anti-inflammatories (NSAID): Secondary | ICD-10-CM | POA: Insufficient documentation

## 2016-08-07 DIAGNOSIS — J45909 Unspecified asthma, uncomplicated: Secondary | ICD-10-CM | POA: Insufficient documentation

## 2016-08-07 DIAGNOSIS — Z79891 Long term (current) use of opiate analgesic: Secondary | ICD-10-CM | POA: Insufficient documentation

## 2016-08-07 LAB — CBC
HEMATOCRIT: 32.7 % — AB (ref 36.0–46.0)
HEMOGLOBIN: 10.3 g/dL — AB (ref 12.0–15.0)
MCH: 28.8 pg (ref 26.0–34.0)
MCHC: 31.5 g/dL (ref 30.0–36.0)
MCV: 91.3 fL (ref 78.0–100.0)
Platelets: 277 10*3/uL (ref 150–400)
RBC: 3.58 MIL/uL — ABNORMAL LOW (ref 3.87–5.11)
RDW: 15.2 % (ref 11.5–15.5)
WBC: 3.4 10*3/uL — ABNORMAL LOW (ref 4.0–10.5)

## 2016-08-07 LAB — BASIC METABOLIC PANEL
ANION GAP: 6 (ref 5–15)
BUN: 17 mg/dL (ref 6–20)
CALCIUM: 9.2 mg/dL (ref 8.9–10.3)
CHLORIDE: 107 mmol/L (ref 101–111)
CO2: 25 mmol/L (ref 22–32)
Creatinine, Ser: 0.53 mg/dL (ref 0.44–1.00)
GFR calc Af Amer: >60 mL/min (ref >60)
GFR calc non Af Amer: >60 mL/min (ref >60)
GLUCOSE: 96 mg/dL (ref 65–99)
POTASSIUM: 4 mmol/L (ref 3.5–5.1)
Sodium: 138 mmol/L (ref 135–145)

## 2016-08-07 MED ORDER — CEPHALEXIN 500 MG PO CAPS: 500.0000 mg | ORAL_CAPSULE | Freq: Four times a day (QID) | ORAL | 0 refills | Status: DC

## 2016-08-07 MED ORDER — FUROSEMIDE 40 MG PO TABS: 20.0000 mg | ORAL_TABLET | Freq: Once | ORAL | Status: DC

## 2016-08-07 MED ORDER — FUROSEMIDE 20 MG PO TABS: 20.0000 mg | ORAL_TABLET | Freq: Every day | ORAL | 0 refills | Status: DC

## 2016-08-07 NOTE — ED Notes (Signed)
1 IV attempt unsuccessful. Another RN to bedside to attempt IV insertion.

## 2016-08-07 NOTE — ED Provider Notes (Signed)
[facility]-EMERGENCY DEPT Provider Note   CSN: [id number] Arrival date & time: 08/07/16  [id number]  History   Chief Complaint Chief Complaint  Patient presents with  . Leg Swelling   HPI Henderson CloudShirley S Beasley is a 50 y.o. female.  HPI  50 y.o. female with a hx of asthma, presents to the Emergency Department today complaining of left leg swelling with symptoms lasting 1-2 weeks. Notes similar hx in the past. States that she has been seen for recurrent cellulitic infections of the left leg since July. Last seen on 07-08-16 for same and refused admission for IV ABX and observation for left leg. Currently rates pain 4/10 that is adequately managed with ibuprofen. No fevers. No N/V. No CP/SOB/ABD pain. No hx DVT/PE. Pt not on blood thinners. Pt states that the swelling usually occurs due to working on her feet all day. States that she is able to go home and elevate her leg and decrease the swelling significantly. Notes her leg was "normal" this past weekend. No other symptoms noted.    Past Medical History:  Diagnosis Date  . Asthma     Patient Active Problem List   Diagnosis Date Noted  . SIRS (systemic inflammatory response syndrome) (HCC) 06/26/2016  . Cellulitis 06/26/2016  . PNA (pneumonia) 06/26/2016    Past Surgical History:  Procedure Laterality Date  . TUBAL LIGATION      OB History    Gravida Para Term Preterm AB Living   3 3 3          SAB TAB Ectopic Multiple Live Births                   Home Medications    Prior to Admission medications   Medication Sig Start Date End Date Taking? Authorizing Provider  amoxicillin-clavulanate (AUGMENTIN) 875-125 MG tablet Take 1 tablet by mouth 2 (two) times daily. Patient not taking: Reported on 07/08/2016 06/28/16   Penny Piarlando Vega, MD  cephALEXin (KEFLEX) 250 MG capsule Take 1 capsule (250 mg total) by mouth 4 (four) times daily. 07/08/16   Roxy Horsemanobert Browning, PA-C  doxycycline (VIBRA-TABS) 100 MG tablet Take 1 tablet (100 mg total) by mouth 2  (two) times daily. Patient not taking: Reported on 07/08/2016 06/28/16   Penny Piarlando Vega, MD  HYDROcodone-acetaminophen (NORCO/VICODIN) 5-325 MG tablet Take 1-2 tablets by mouth every 4 (four) hours as needed for moderate pain. Patient not taking: Reported on 07/08/2016 06/28/16   Penny Piarlando Vega, MD  ibuprofen (ADVIL,MOTRIN) 200 MG tablet Take 800 mg by mouth every 6 (six) hours as needed for headache, moderate pain or cramping.     Historical Provider, MD  Multiple Vitamin (MULTIVITAMIN WITH MINERALS) TABS tablet Take 1 tablet by mouth daily.    Historical Provider, MD    Family History No family history on file.  Social History Social History  Substance Use Topics  . Smoking status: Former Games developermoker  . Smokeless tobacco: Never Used  . Alcohol use Yes     Comment: ocassionally     Allergies   Review of patient's allergies indicates no known allergies.   Review of Systems Review of Systems ROS reviewed and all are negative for acute change except as noted in the HPI.  Physical Exam Updated Vital Signs BP 147/88 (BP Location: Right Arm)   Pulse 79   Temp 97.9 F (36.6 C) (Oral)   Resp 18   Ht 5\' 6"  (1.676 m)   Wt 99.8 kg   LMP 08/05/2016   SpO2 100%  BMI 35.51 kg/m   Physical Exam  Constitutional: She is oriented to person, place, and time. Vital signs are normal. She appears well-developed and well-nourished.  HENT:  Head: Normocephalic.  Right Ear: Hearing normal.  Left Ear: Hearing normal.  Eyes: Conjunctivae and EOM are normal. Pupils are equal, round, and reactive to light.  Cardiovascular: Normal rate, regular rhythm, normal heart sounds and intact distal pulses.   Pulmonary/Chest: Effort normal and breath sounds normal.  Musculoskeletal:  Left Lower Leg with swelling. Non pitting. Non erythematous. Non infectious looking. Neurovascularly intact with distal pulses appreciated. Motor/sensation intact. Swelling reaches below knee.  Neurological: She is alert and oriented  to person, place, and time.  Skin: Skin is warm and dry.  Psychiatric: She has a normal mood and affect. Her speech is normal and behavior is normal. Thought content normal.     ED Treatments / Results  Labs (all labs ordered are listed, but only abnormal results are displayed) Labs Reviewed  CBC - Abnormal; Notable for the following:       Result Value   WBC 3.4 (*)    RBC 3.58 (*)    Hemoglobin 10.3 (*)    HCT 32.7 (*)    All other components within normal limits  BASIC METABOLIC PANEL   EKG  EKG Interpretation None      Radiology No results found.  Procedures Procedures (including critical care time)  Medications Ordered in ED Medications - No data to display   Initial Impression / Assessment and Plan / ED Course  I have reviewed the triage vital signs and the nursing notes.  Pertinent labs & imaging results that were available during my care of the patient were reviewed by me and considered in my medical decision making (see chart for details).  Clinical Course   Final Clinical Impressions(s) / ED Diagnoses  I have reviewed and evaluated the relevant laboratory values I have reviewed and evaluated the relevant imaging studies.  I have reviewed the relevant previous healthcare records. I obtained HPI from historian. Patient discussed with supervising physician  ED Course:  Assessment: Pt is a 49yF who presents with left leg swelling. Notes reoccurrence with this left leg. Treated for cellulitis in the past. Notes improvement of symptoms when elevating leg. On exam, pt in NAD. Nontoxic/nonseptic appearing. VSS. Afebrile. Lungs CTA. Heart RRR. Left leg with swelling below knee. Non pitting. Non erythematous. No purulence. Non infectious looking. CBC/BMP without acute abnormalities. Previous DVT imaging last month unremrkable. Likely lymphedema from previous cellulitic infection. No leukocytosis. No fever. Given Rx Lasix as well as Compression stocking. Given Rx Keflex  in case of infection. Plan [facility] Discussed with supervising physician who agrees with plan. At time of discharge, Patient is in no acute distress. Vital Signs are stable. Patient is able to ambulate. Patient able to tolerate PO.    Disposition/Plan:  DC Home Additional Verbal discharge instructions given and discussed with patient.  Pt Instructed to f/u with PCP in the next week for evaluation and treatment of symptoms. Return precautions given Pt acknowledges and agrees with plan  Supervising Physician Linwood DibblesJon Knapp, MD   Final diagnoses:  Lymphedema of left leg    New Prescriptions New Prescriptions   CEPHALEXIN (KEFLEX) 500 MG CAPSULE    Take 1 capsule (500 mg total) by mouth 4 (four) times daily.   FUROSEMIDE (LASIX) 20 MG TABLET    Take 1 tablet (20 mg  total) by mouth daily.      Audry Pili, PA-C 08/07/16 1610    Linwood Dibbles, MD 08/09/16 [phone]

## 2016-08-07 NOTE — ED Triage Notes (Signed)
Patient c/o left leg and foot swelling about 2 weeks.  patient was treated for cellulitis on that leg before. Patient states that swelling gets worse when she works.

## 2016-08-07 NOTE — ED Notes (Signed)
Discharge instructions, follow up care, and rx x2 reviewed with patient. Patient verbalized understanding. 

## 2016-08-07 NOTE — Discharge Instructions (Signed)
Please read and follow all provided instructions.  Your diagnoses today include:  1. Lymphedema of left leg    Tests performed today include: Vital signs. See below for your results today.   Medications prescribed:  Take as prescribed   Home care instructions:  Follow any educational materials contained in this packet.  Follow-up instructions: Please follow-up with [facility] to set up a Primary Care Provider for further evaluation of symptoms and treatment   Return instructions:  Please return to the Emergency Department if you do not get better, if you get worse, or new symptoms OR  - Fever (temperature greater than 101.41F)  - Bleeding that does not stop with holding pressure to the area    -Severe pain (please note that you may be more sore the day after your accident)  - Chest Pain  - Difficulty breathing  - Severe nausea or vomiting  - Inability to tolerate food and liquids  - Passing out  - Skin becoming red around your wounds  - Change in mental status (confusion or lethargy)  - New numbness or weakness    Please return if you have any other emergent concerns.  Additional Information:  Your vital signs today were: BP 147/88 (BP Location: Right Arm)    Pulse 79    Temp 97.9 F (36.6 C) (Oral)    Resp 18    Ht 5\' 6"  (1.676 m)    Wt 99.8 kg    LMP 08/05/2016    SpO2 100%    BMI 35.51 kg/m  If your blood pressure (BP) was elevated above 135/85 this visit, please have this repeated by your doctor within one month. ---------------

## 2016-08-17 ENCOUNTER — Encounter: Payer: Self-pay | Admitting: Internal Medicine

## 2016-08-17 ENCOUNTER — Ambulatory Visit: Payer: Self-pay | Attending: Internal Medicine | Admitting: Internal Medicine

## 2016-08-17 DIAGNOSIS — I890 Lymphedema, not elsewhere classified: Secondary | ICD-10-CM

## 2016-08-17 HISTORY — DX: Lymphedema, not elsewhere classified: I89.0

## 2016-08-17 MED ORDER — FUROSEMIDE 20 MG PO TABS: 20.0000 mg | ORAL_TABLET | Freq: Every day | ORAL | 0 refills | Status: DC | PRN

## 2016-08-17 NOTE — Patient Instructions (Addendum)
It was good to see you today.  We have reviewed your prior records including labs and tests today   Okay to continue furosemide once daily as needed for fluid and swelling and leg. Do not take if no swelling. Also continue compression stocking on left leg as discussed every day to control swelling  Please schedule followup in 6 months for review, call sooner if problems.  Lymphedema Lymphedema is swelling that is caused by the abnormal collection of lymph under the skin. Lymph is fluid from the tissues in your body that travels in the lymphatic system. This system is part of the immune system and includes lymph nodes and lymph vessels. The lymph vessels collect and carry the excess fluid, fats, proteins, and wastes from the tissues of the body to the bloodstream. This system also works to clean and remove bacteria and waste products from the body. Lymphedema occurs when the lymphatic system is blocked. When the lymph vessels or lymph nodes are blocked or damaged, lymph does not drain properly, causing an abnormal buildup of lymph. This leads to swelling in the arms or legs. Lymphedema cannot be cured by medicines, but various methods can be used to help reduce the swelling. CAUSES There are two types of lymphedema. Primary lymphedema is caused by the absence or abnormality of the lymph vessel at birth. Secondary lymphedema is more common. It occurs when the lymph vessel is damaged or blocked. Common causes of lymph vessel blockage include:  Skin infection, such as cellulitis.  Infection by parasites (filariasis).  Injury.  Cancer.  Radiation therapy.  Formation of scar tissue.  Surgery. SYMPTOMS Symptoms of this condition include:  Swelling of the arm or leg.  A heavy or tight feeling in the arm or leg.  Swelling of the feet, toes, or fingers. Shoes or rings may fit more tightly than before.  Redness of the skin over the affected area.  Limited movement of the affected  limb.  Sensitivity to touch or discomfort in the affected limb. DIAGNOSIS This condition may be diagnosed with:  A physical exam.  Medical history.  Imaging tests, such as:  Lymphoscintigraphy. In this test, a low dose of a radioactive substance is injected to trace the flow of lymph through the lymph vessels.  MRI.  CT scan.  Duplex ultrasound. This test uses sound waves to produce images of the vessels and the blood flow on a screen.  Lymphangiography. In this test, a contrast dye is injected into the lymph vessel to help show blockages. TREATMENT Treatment for this condition may depend on the cause. Treatment may include:  Exercise. Certain exercises can help fluid move out of the affected limb.  Massage. Gentle massage of the affected limb can help move the fluid out of the area.  Compression. Various methods may be used to apply pressure to the affected limb in order to reduce the swelling.  Wearing compression stockings or sleeves on the affected limb.  Bandaging the affected limb.  Using an external pump that is attached to a sleeve that alternates between applying pressure and releasing pressure.  Surgery. This is usually only done for severe cases. For example, surgery may be done if you have trouble moving the limb or if the swelling does not get better with other treatments. If an underlying condition is causing the lymphedema, treatment for that condition is needed. For example, antibiotic medicines may be used to treat an infection. HOME CARE INSTRUCTIONS Activities  Exercise regularly as directed by your health  care provider.  Do not sit with your legs crossed.  When possible, keep the affected limb raised (elevated) above the level of your heart.  Avoid carrying things with an arm that is affected by lymphedema.  Remember that the affected area is more likely to become injured or infected.  Take these steps to help prevent infection:  Keep the  affected area clean and dry.  Protect your skin from cuts. For example, you should use gloves while cooking or gardening. Do not walk barefoot. If you shave the affected area, use an Neurosurgeon. General Instructions  Take medicines only as directed by your health care provider.  Eat a healthy diet that includes a lot of fruits and vegetables.  Do not wear tight clothes, shoes, or jewelry.  Do not use heating pads over the affected area.  Avoid having blood pressure checked on the affected limb.  Keep all follow-up visits as directed by your health care provider. This is important. SEEK MEDICAL CARE IF:  You continue to have swelling in your limb.  You have a fever.  You have a cut that does not heal.  You have redness or pain in the affected area.  You have new swelling in your limb that comes on suddenly.  You develop purplish spots or sores (lesions) on your limb. SEEK IMMEDIATE MEDICAL CARE IF:  You have a skin rash.  You have chills or sweats.  You have shortness of breath.   This information is not intended to replace advice given to you by your health care provider. Make sure you discuss any questions you have with your health care provider.   Document Released: 10/02/2007 Document Revised: 04/21/2015 Document Reviewed: 11/12/2014 Elsevier Interactive Patient Education [employer]

## 2016-08-17 NOTE — Assessment & Plan Note (Signed)
Education and reassurance provided Continue compression hose and diuretic low dose qd prn - new erx done

## 2016-08-17 NOTE — Progress Notes (Signed)
Patient is here for HFU  Patient denies pain at this time.  Patient declined flu shot today.  Patient has taken medication today and patient has eaten today.

## 2016-08-17 NOTE — Progress Notes (Signed)
   Subjective:    Patient ID: Brittany Beasley, female    DOB: 03/10/1966, 50 y.o.   MRN: [id number]  HPI  Patient here for Emergency department follow-up regarding left leg swelling. Prior history of cellulitis in the setting of lymphedema, most recent visit felt not to be infectious etiology and treated with diuretic and compression stocking. Since that time, patient reports swelling improved, near baseline and would like to continue diuretics  Past Medical History:  Diagnosis Date  . Asthma   . Lymphedema 08/17/2016   LLE following cellulitis 06/2016    Review of Systems  Constitutional: Negative for fatigue, fever and unexpected weight change.  Musculoskeletal: Negative for joint swelling.       Objective:    Physical Exam  Constitutional: She is oriented to person, place, and time. She appears well-developed and well-nourished. No distress.  overweight  Cardiovascular: Normal rate, regular rhythm and normal heart sounds.   No murmur heard. Pulmonary/Chest: Effort normal and breath sounds normal. No respiratory distress.  Musculoskeletal: She exhibits edema (LLE lympedema). She exhibits no tenderness.  Neurological: She is alert and oriented to person, place, and time.    BP 131/83 (BP Location: Left Arm, Patient Position: Sitting, Cuff Size: Large)   Pulse 79   Temp 98.3 F (36.8 C) (Oral)   Resp 18   Ht 5\' 8"  (1.727 m)   Wt 103.9 kg (229 lb)   LMP 08/05/2016   SpO2 100%   BMI 34.82 kg/m  Wt Readings from Last 3 Encounters:  08/17/16 103.9 kg (229 lb)  08/07/16 99.8 kg (220 lb)  06/26/16 104.8 kg (231 lb 0.7 oz)     Lab Results  Component Value Date   WBC 3.4 (L) 08/07/2016   HGB 10.3 (L) 08/07/2016   HCT 32.7 (L) 08/07/2016   PLT 277 08/07/2016   GLUCOSE 96 08/07/2016   ALT 19 06/27/2016   AST 15 06/27/2016   NA 138 08/07/2016   K 4.0 08/07/2016   CL 107 08/07/2016   CREATININE 0.53 08/07/2016   BUN 17 08/07/2016   CO2 25 08/07/2016    No results  found.     Assessment & Plan:   Problem List Items Addressed This Visit    Lymphedema    Education and reassurance provided Continue compression hose and diuretic low dose qd prn - new erx done       Other Visit Diagnoses   None.      Rene PaciValerie Shey Bartmess, MD

## 2017-03-25 ENCOUNTER — Encounter (HOSPITAL_COMMUNITY): Payer: Self-pay | Admitting: *Deleted

## 2017-03-25 ENCOUNTER — Emergency Department (HOSPITAL_COMMUNITY)
Admission: EM | Admit: 2017-03-25 | Discharge: 2017-03-25 | Disposition: A | Discharge status: Home / Self Care | Payer: Self-pay | Attending: Physician Assistant | Admitting: Physician Assistant

## 2017-03-25 ENCOUNTER — Emergency Department (HOSPITAL_COMMUNITY): Payer: Self-pay

## 2017-03-25 DIAGNOSIS — I890 Lymphedema, not elsewhere classified: Secondary | ICD-10-CM | POA: Insufficient documentation

## 2017-03-25 DIAGNOSIS — J45909 Unspecified asthma, uncomplicated: Secondary | ICD-10-CM | POA: Insufficient documentation

## 2017-03-25 DIAGNOSIS — Z87891 Personal history of nicotine dependence: Secondary | ICD-10-CM | POA: Insufficient documentation

## 2017-03-25 NOTE — ED Notes (Signed)
Pt is alert and oriented x 4. Pt has marked edema to bilateral legs with Left leg appearing greater and 3+ pitting edema. Pt came out of room and stated " has the doctor seen my results I have got to go". Made pt aware that I would make the MD aware.

## 2017-03-25 NOTE — ED Notes (Signed)
Bedside reporting done with Carrie, RN  

## 2017-03-25 NOTE — Discharge Instructions (Signed)
Your x-ray showed mild vascular congestion. It is possibly you have an element of heart failure. We need you to follow up with her primary care physician as an outpatient. We continue list of primary care's. He should try [facility] that acepts people without insurance.  Any short of breath return immediately to the emergency department.  To find a primary care or specialty doctor please call [phone] or [phone] to access "[facility] Find a Doctor Service."  You may also go on the [facility] website at [email or web address]  There are also multiple [facility] practices throughout the [place] that are frequently accepting new patients. You may find a clinic that is close to your home and contact them.  [facility] -  [address] [place] [address] [phone]  [facility] in [place] (also locations in [place] and [place]) -  [address] [place] [address] [phone]  [facility] -  [address] [place] [address] [phone]

## 2017-03-25 NOTE — ED Notes (Signed)
Patient reports that swelling in bilat lower legs gets worse during the day then goes down over the night. Patient states that she stands on her feet a lot while at work. Patient states that tried compression stockings but thinks she might not be buying the right ones.  Patient has greater edema to left lower leg than on right lower leg.

## 2017-03-25 NOTE — ED Provider Notes (Signed)
[facility]-EMERGENCY DEPT Provider Note   CSN: [id number] Arrival date & time: 03/25/17  1710     History   Chief Complaint Chief Complaint  Patient presents with  . Leg Swelling    HPI Brittany Beasley is a 51 y.o. female.  HPI   Patient is a 51 year old female presenting with chronic lymphedema. Patient has chronic lymphedema in the left leg has been well documented several time and last emergency department visits. Occasionally she has cellulitis as well. Today she reports that she's been having increasing swelling. She says this is her normal swelling. He gets better at night which puts her legs up and gets worse at work when she stands on her feet for long periods of time. Patient had no shortness of breath. There is no reason to think patient has risk factors for pulmonary embolism, she has normal heart rate, it is PERC negative.  Past Medical History:  Diagnosis Date  . Asthma   . Lymphedema 08/17/2016   LLE following cellulitis 06/2016    Patient Active Problem List   Diagnosis Date Noted  . Lymphedema 08/17/2016    Past Surgical History:  Procedure Laterality Date  . TUBAL LIGATION      OB History    Gravida Para Term Preterm AB Living   SAB TAB Ectopic Multiple Live Births                   Home Medications    Prior to Admission medications   Medication Sig Start Date End Date Taking? Authorizing Provider  acetaminophen (TYLENOL) 500 MG tablet Take 1,000 mg by mouth every 6 (six) hours as needed for mild pain, moderate pain or headache.   Yes Historical Provider, MD  ibuprofen (ADVIL,MOTRIN) 200 MG tablet Take 600 mg by mouth every 6 (six) hours as needed for headache, mild pain or moderate pain.    Yes Historical Provider, MD  furosemide (LASIX) 20 MG tablet Take 1 tablet (20 mg total) by mouth daily as needed for fluid. Patient not taking: Reported on 03/25/2017 08/17/16   Newt Lukes, MD    Family History No family history on  file.  Social History Social History  Substance Use Topics  . Smoking status: Former Games developer  . Smokeless tobacco: Never Used  . Alcohol use Yes     Comment: ocassionally     Allergies   Patient has no known allergies.   Review of Systems Review of Systems  Constitutional: Negative for activity change.  Respiratory: Negative for cough and shortness of breath.   Cardiovascular: Negative for chest pain.  Gastrointestinal: Negative for abdominal pain.  All other systems reviewed and are negative.    Physical Exam Updated Vital Signs BP (!) 156/99 (BP Location: Left Arm)   Pulse 88   Temp 97.6 F (36.4 C) (Oral)   Resp 20   LMP 03/18/2017   SpO2 100%   Physical Exam  Constitutional: She is oriented to person, place, and time. She appears well-developed and well-nourished.  HENT:  Head: Normocephalic and atraumatic.  Eyes: Right eye exhibits no discharge.  Cardiovascular: Normal rate, regular rhythm and normal heart sounds.   No murmur heard. Pulmonary/Chest: Effort normal and breath sounds normal. She has no wheezes. She has no rales.  Abdominal: Soft. She exhibits no distension. There is no tenderness.  Musculoskeletal:  Left leg with chronic lymphedema. Right leg with mild swelling at baseline.  Neurological:  She is oriented to person, place, and time.  Skin: Skin is warm and dry. She is not diaphoretic.  Psychiatric: She has a normal mood and affect.  Nursing note and vitals reviewed.    ED Treatments / Results  Labs (all labs ordered are listed, but only abnormal results are displayed) Labs Reviewed - No data to display  EKG  EKG Interpretation None       Radiology Dg Chest 2 View  Result Date: 03/25/2017 CLINICAL DATA:  Chronic lymphedema, with worsening lower extremity swelling, acute onset. Initial encounter. EXAM: CHEST  2 VIEW COMPARISON:  Chest radiograph and CT of the chest performed 06/26/2016 FINDINGS: The lungs are well-aerated. Mild  vascular congestion is noted. There is no evidence of focal opacification, pleural effusion or pneumothorax. The heart is borderline enlarged. No acute osseous abnormalities are seen. IMPRESSION: Mild vascular congestion and borderline cardiomegaly. Lungs remain grossly clear. Electronically Signed   By: Roanna Raider M.D.   On: 03/25/2017 19:56    Procedures Procedures (including critical care time)  Medications Ordered in ED Medications - No data to display   Initial Impression / Assessment and Plan / ED Course  I have reviewed the triage vital signs and the nursing notes.  Pertinent labs & imaging results that were available during my care of the patient were reviewed by me and considered in my medical decision making (see chart for details).     All. 51 year old female presenting with lymphedema to the left leg. She reports chronic nature worse with standing. We will give her a work note which will help her raise her legs for the next 2 days to decrease swelling. Patient reports that she is trying to get in with primary care. We'll give her [facility] illness phone number. I do not believe that represents acute process as its been  Going on for a number of years. I do not believe that represents DVT or cellulitic component or acute heart failure.   Chest xray shows mild congestion.  We want her to follow up this week for PCP, potential echo.  No hypoxia or tachycardia and the swelling is better with elevation, and she reports it is similar to prior.  Therefore I think patient is safe to discharge to follow up with PCP as outpatient. .  Final Clinical Impressions(s) / ED Diagnoses   Final diagnoses:  Lymphedema    New Prescriptions Discharge Medication List as of 03/25/2017  8:34 PM       Kidus Delman Lyn Servando Kyllonen, MD 03/25/17 2341

## 2017-03-25 NOTE — ED Triage Notes (Signed)
Pt complains of lower leg swelling, worse in left leg, for the past week. Pt tried compression socks with no relief. Pt states she works on her feet a lot. Pt denies any liver, heart or lung problems.

## 2017-03-31 ENCOUNTER — Ambulatory Visit: Payer: Self-pay | Attending: Internal Medicine | Admitting: Physician Assistant

## 2017-03-31 VITALS — BP 149/86 | HR 89 | Temp 98.6°F | Resp 16 | Wt 230.4 lb

## 2017-03-31 DIAGNOSIS — M7989 Other specified soft tissue disorders: Secondary | ICD-10-CM | POA: Insufficient documentation

## 2017-03-31 DIAGNOSIS — J45909 Unspecified asthma, uncomplicated: Secondary | ICD-10-CM | POA: Insufficient documentation

## 2017-03-31 DIAGNOSIS — I890 Lymphedema, not elsewhere classified: Secondary | ICD-10-CM | POA: Insufficient documentation

## 2017-03-31 MED ORDER — FUROSEMIDE 20 MG PO TABS: ORAL_TABLET | ORAL | 0 refills | Status: DC

## 2017-03-31 NOTE — Patient Instructions (Signed)
Edema Edema is when you have too much fluid in your body or under your skin. Edema may make your legs, feet, and ankles swell up. Swelling is also common in looser tissues, like around your eyes. This is a common condition. It gets more common as you get older. There are many possible causes of edema. Eating too much salt (sodium) and being on your feet or sitting for a long time can cause edema in your legs, feet, and ankles. Hot weather may make edema worse. Edema is usually painless. Your skin may look swollen or shiny. Follow these instructions at home:  Keep the swollen body part raised (elevated) above the level of your heart when you are sitting or lying down.  Do not sit still or stand for a long time.  Do not wear tight clothes. Do not wear garters on your upper legs.  Exercise your legs. This can help the swelling go down.  Wear elastic bandages or support stockings as told by your doctor.  Eat a low-salt (low-sodium) diet to reduce fluid as told by your doctor.  Depending on the cause of your swelling, you may need to limit how much fluid you drink (fluid restriction).  Take over-the-counter and prescription medicines only as told by your doctor. Contact a doctor if:  Treatment is not working.  You have heart, liver, or kidney disease and have symptoms of edema.  You have sudden and unexplained weight gain. Get help right away if:  You have shortness of breath or chest pain.  You cannot breathe when you lie down.  You have pain, redness, or warmth in the swollen areas.  You have heart, liver, or kidney disease and get edema all of a sudden.  You have a fever and your symptoms get worse all of a sudden. Summary  Edema is when you have too much fluid in your body or under your skin.  Edema may make your legs, feet, and ankles swell up. Swelling is also common in looser tissues, like around your eyes.  Raise (elevate) the swollen body part above the level of your  heart when you are sitting or lying down.  Follow your doctor's instructions about diet and how much fluid you can drink (fluid restriction). This information is not intended to replace advice given to you by your health care provider. Make sure you discuss any questions you have with your health care provider. Document Released: 05/23/2008 Document Revised: 12/23/2016 Document Reviewed: 12/23/2016 Elsevier Interactive Patient Education  2017 Else[employer] Inc.  

## 2017-03-31 NOTE — Progress Notes (Signed)
Patient ID: Brittany Beasley, female   DOB: 30-Jul-1966, 51 y.o.   MRN: [id number]       Brittany Beasley, is a 51 y.o. female  WUJ:[id number]  [id number]  DOB - 02-12-1966  Subjective:  Chief Complaint and HPI: [name] is a 51 y.o. female here today to establish care and for a follow up visit after being in the ED on 03/25/2017 for chronic lymphedema of the L leg.  She has been having trouble with this now for about 1 year.  She does not know of any injury or event that precipitated her starting to have the leg swelling.  She denies SOB. She has had some episodes of cellulitis.  She denies orthopnea or s/sx of failure. She has been to ED/hospitalized for this problem several times over the last year.  Swelling is worse after standing and working.  Better after elevation and rest.  TOday she presents with continued L leg swelling.   ED/Hospital notes reviewed. CXR with mild vascular congestion and borderline cardiomegaly; lungs WNL.  Social History:  Works 2 jobs, non-smoker  ROS:   Constitutional:  No f/c, No night sweats, No unexplained weight loss. EENT:  No vision changes, No blurry vision, No hearing changes. No mouth, throat, or ear problems.  Respiratory: No cough, No SOB Cardiac: No CP, no palpitations GI:  No abd pain, No N/V/D. GU: No Urinary s/sx Musculoskeletal: No joint pain Neuro: No headache, no dizziness, no motor weakness.  Skin: No rash Endocrine:  No polydipsia. No polyuria.  Psych: Denies SI/HI  No problems updated.  ALLERGIES: No Known Allergies  PAST MEDICAL HISTORY: Past Medical History:  Diagnosis Date  . Asthma   . Lymphedema 08/17/2016   LLE following cellulitis 06/2016    MEDICATIONS AT HOME: Prior to Admission medications   Medication Sig Start Date End Date Taking? Authorizing Provider  acetaminophen (TYLENOL) 500 MG tablet Take 1,000 mg by mouth every 6 (six) hours as needed for mild pain, moderate pain or headache.    Historical  Provider, MD  furosemide (LASIX) 20 MG tablet Take 1 daily for 10 days then as needed for swelling 03/31/17   Anders Simmonds, PA-C  ibuprofen (ADVIL,MOTRIN) 200 MG tablet Take 600 mg by mouth every 6 (six) hours as needed for headache, mild pain or moderate pain.     Historical Provider, MD     Objective:  EXAM:   Vitals:   03/31/17 1349  BP: (!) 149/86  Pulse: 89  Resp: 16  Temp: 98.6 F (37 C)  TempSrc: Oral  SpO2: 96%  Weight: 230 lb 6.4 oz (104.5 kg)    General appearance : A&OX3. NAD. Non-toxic-appearing HEENT: Atraumatic and Normocephalic.  PERRLA. EOM intact.  TM clear B. Mouth-MMM, post pharynx WNL w/o erythema, No PND. Neck: supple, no JVD. No cervical lymphadenopathy. No thyromegaly Chest/Lungs:  Breathing-non-labored, Good air entry bilaterally, breath sounds normal without rales, rhonchi, or wheezing  CVS: S1 S2 regular, no murmurs, gallops, rubs  Abdomen: Bowel sounds present, Non tender and not distended with no gaurding, rigidity or rebound. Extremities: Both legs are warm to touch with = pulse throughout.  R leg with mild non-pitting edema.  L leg with 2-3+ pretibial edema.  No open skin or seeping of the skin.   Neurology:  CN II-XII grossly intact, Non focal.   Psych:  TP linear. J/I WNL. Normal speech. Appropriate eye contact and affect.  Skin:  No Rash  Data Review No results found for:  HGBA1C   Assessment & Plan   1. Lymphedema No signs of failure Unclear etiology but occurring now for about 1 year L>>R.  Needs furhter work-up.  She is going to apply for financial assistance and we can proceed furhter from there.  For today:  - Comprehensive metabolic panel - TSH - CBC with Differential/Platelet - furosemide (LASIX) 20 MG tablet; Take 1 daily for 10 days then as needed for swelling  Dispense: 30 tablet; Refill: 0  Patient have been counseled extensively about nutrition and exercise  Return in about 3 weeks (around 04/21/2017) for assign PCP and  f/up edema.  The patient was given clear instructions to go to ER or return to medical center if symptoms don't improve, worsen or new problems develop. The patient verbalized understanding. The patient was told to call to get lab results if they haven't heard anything in the next week.     Georgian Co, PA-C [facility] [place], [place] [phone]   04/02/2017, 9:19 AM

## 2017-04-01 LAB — CBC WITH DIFFERENTIAL/PLATELET
Basophils Absolute: 0 10*3/uL (ref 0.0–0.2)
Basos: 1 %
EOS (ABSOLUTE): 0.2 10*3/uL (ref 0.0–0.4)
EOS: 4 %
HEMATOCRIT: 32.6 % — AB (ref 34.0–46.6)
HEMOGLOBIN: 10.2 g/dL — AB (ref 11.1–15.9)
IMMATURE GRANS (ABS): 0 10*3/uL (ref 0.0–0.1)
IMMATURE GRANULOCYTES: 0 %
LYMPHS ABS: 1.4 10*3/uL (ref 0.7–3.1)
LYMPHS: 34 %
MCH: 27.1 pg (ref 26.6–33.0)
MCHC: 31.3 g/dL — ABNORMAL LOW (ref 31.5–35.7)
MCV: 87 fL (ref 79–97)
MONOCYTES: 10 %
Monocytes Absolute: 0.4 10*3/uL (ref 0.1–0.9)
Neutrophils Absolute: 2.2 10*3/uL (ref 1.4–7.0)
Neutrophils: 51 %
Platelets: 294 10*3/uL (ref 150–379)
RBC: 3.76 x10E6/uL — AB (ref 3.77–5.28)
RDW: 16.7 % — ABNORMAL HIGH (ref 12.3–15.4)
WBC: 4.3 10*3/uL (ref 3.4–10.8)

## 2017-04-01 LAB — COMPREHENSIVE METABOLIC PANEL
A/G RATIO: 1 — AB (ref 1.2–2.2)
ALK PHOS: 78 IU/L (ref 39–117)
ALT: 16 IU/L (ref 0–32)
AST: 15 IU/L (ref 0–40)
Albumin: 4.1 g/dL (ref 3.5–5.5)
BILIRUBIN TOTAL: 0.3 mg/dL (ref 0.0–1.2)
BUN / CREAT RATIO: 23 (ref 9–23)
BUN: 13 mg/dL (ref 6–24)
CHLORIDE: 103 mmol/L (ref 96–106)
CO2: 23 mmol/L (ref 18–29)
Calcium: 9 mg/dL (ref 8.7–10.2)
Creatinine, Ser: 0.57 mg/dL (ref 0.57–1.00)
GFR calc non Af Amer: 109 mL/min/{1.73_m2} (ref >59)
GFR, EST AFRICAN AMERICAN: 126 mL/min/{1.73_m2} (ref >59)
GLUCOSE: 85 mg/dL (ref 65–99)
Globulin, Total: 4 g/dL (ref 1.5–4.5)
POTASSIUM: 4.2 mmol/L (ref 3.5–5.2)
Sodium: 141 mmol/L (ref 134–144)
TOTAL PROTEIN: 8.1 g/dL (ref 6.0–8.5)

## 2017-04-01 LAB — TSH: TSH: 1.39 u[IU]/mL (ref 0.450–4.500)

## 2017-04-07 ENCOUNTER — Telehealth: Payer: Self-pay

## 2017-04-07 NOTE — Telephone Encounter (Signed)
Contacted pt to go lab results pt is aware. Pt is requesting for a rx for iron pills to be sent to [facility] on [address].

## 2017-04-10 ENCOUNTER — Other Ambulatory Visit: Payer: Self-pay | Admitting: Pharmacist

## 2017-04-10 MED ORDER — FERROUS SULFATE 325 (65 FE) MG PO TABS: 325.0000 mg | ORAL_TABLET | Freq: Every day | ORAL | 0 refills | Status: DC

## 2017-04-10 NOTE — Telephone Encounter (Signed)
Patient called for prescription for iron. Per Marylene Land McClung's note, patient to take iron once daily. Will send in script for ferrous sulfate 325 mg daily x 30 days, and patient will need to be established for further refills.

## 2017-04-11 ENCOUNTER — Other Ambulatory Visit: Payer: Self-pay | Admitting: Physician Assistant

## 2017-04-11 MED ORDER — FERROUS SULFATE 325 (65 FE) MG PO TABS: 325.0000 mg | ORAL_TABLET | Freq: Every day | ORAL | 1 refills | Status: DC

## 2017-04-11 NOTE — Telephone Encounter (Signed)
Rx sent. Thanks

## 2017-04-24 ENCOUNTER — Ambulatory Visit: Payer: Self-pay | Attending: Family Medicine | Admitting: Family Medicine

## 2017-04-24 ENCOUNTER — Encounter: Payer: Self-pay | Admitting: Family Medicine

## 2017-04-24 ENCOUNTER — Other Ambulatory Visit: Payer: Self-pay

## 2017-04-24 VITALS — BP 141/99 | HR 85 | Temp 98.3°F | Resp 18 | Ht 68.0 in | Wt 236.0 lb

## 2017-04-24 DIAGNOSIS — Z0000 Encounter for general adult medical examination without abnormal findings: Secondary | ICD-10-CM

## 2017-04-24 DIAGNOSIS — I10 Essential (primary) hypertension: Secondary | ICD-10-CM

## 2017-04-24 DIAGNOSIS — R062 Wheezing: Secondary | ICD-10-CM | POA: Insufficient documentation

## 2017-04-24 DIAGNOSIS — J209 Acute bronchitis, unspecified: Secondary | ICD-10-CM

## 2017-04-24 DIAGNOSIS — J45909 Unspecified asthma, uncomplicated: Secondary | ICD-10-CM | POA: Insufficient documentation

## 2017-04-24 DIAGNOSIS — J4520 Mild intermittent asthma, uncomplicated: Secondary | ICD-10-CM

## 2017-04-24 DIAGNOSIS — I890 Lymphedema, not elsewhere classified: Secondary | ICD-10-CM

## 2017-04-24 DIAGNOSIS — Z87891 Personal history of nicotine dependence: Secondary | ICD-10-CM | POA: Insufficient documentation

## 2017-04-24 DIAGNOSIS — Z23 Encounter for immunization: Secondary | ICD-10-CM | POA: Insufficient documentation

## 2017-04-24 DIAGNOSIS — Z09 Encounter for follow-up examination after completed treatment for conditions other than malignant neoplasm: Secondary | ICD-10-CM

## 2017-04-24 HISTORY — DX: Wheezing: R06.2

## 2017-04-24 LAB — POCT UA - MICROALBUMIN
CREATININE, POC: 300 mg/dL
MICROALBUMIN (UR) POC: 80 mg/L

## 2017-04-24 MED ORDER — HYDROCHLOROTHIAZIDE 25 MG PO TABS: 25.0000 mg | ORAL_TABLET | Freq: Every day | ORAL | 2 refills | Status: DC

## 2017-04-24 MED ORDER — ALBUTEROL SULFATE HFA 108 (90 BASE) MCG/ACT IN AERS: 2.0000 | INHALATION_SPRAY | Freq: Four times a day (QID) | RESPIRATORY_TRACT | 2 refills | Status: DC | PRN

## 2017-04-24 MED ORDER — FLUTICASONE PROPIONATE 50 MCG/ACT NA SUSP: 1.0000 | Freq: Every day | NASAL | 6 refills | Status: DC

## 2017-04-24 MED ORDER — MONTELUKAST SODIUM 10 MG PO TABS
10.0000 mg | ORAL_TABLET | Freq: Every day | ORAL | 6 refills | Status: DC
Start: 2017-04-24 — End: 2024-04-01

## 2017-04-24 MED ORDER — AZITHROMYCIN 250 MG PO TABS: ORAL_TABLET | ORAL | 0 refills | Status: DC

## 2017-04-24 MED ORDER — IPRATROPIUM-ALBUTEROL 0.5-2.5 (3) MG/3ML IN SOLN: 3.0000 mL | RESPIRATORY_TRACT | Status: DC | PRN

## 2017-04-24 NOTE — Patient Instructions (Signed)

## 2017-04-24 NOTE — Progress Notes (Signed)
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

## 2017-04-24 NOTE — Progress Notes (Signed)
Subjective:  Patient ID: Brittany Beasley, female    DOB: [birth year]  Age: 51 y.o. MRN: [id number]  CC: Establish Care   HPI Brittany Beasley presents for   Chronic lymphedema: Left lower leg. History of several ED visits. She denies any recent increase of swelling. She reports elevating her leg and use of lasix help lessen swelling some but chronic leg swelling still persists. Symptoms are aggravated with working and standing for long periods of time.     Hypertension: Patient here for follow-up of elevated blood pressure. She is not exercising and is adherent to low salt diet.She does not take blood pressures at home.  Cardiac symptoms none. Patient denies chest pain, claudication, palpitations and syncope.  Cardiovascular risk factors: hypertension, sedentary lifestyle and former smoker. Use of agents associated with hypertension: none. History of target organ damage: none.  Upper Respiratory Infection: Patient complains of symptoms of a URI. Symptoms include congestion, coryza, rhinorrhea, and cough. Onset of symptoms was 2 weeks ago, unchanged since that time. She also c/o productive cough with  yellow colored sputum, shortness of breath and wheezing for the past 3 days.  She is drinking plenty of fluids. Evaluation to date: none. Treatment to date: antihistamines and mucinex, and robitussin.  Reports recent sick contact on her job. 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   Asthma: History of asthma.Patient's symptoms include dyspnea, productive cough and wheezing. Associated symptoms include nasal congestion, rhinorrhea , and sneezing. The patient has been suffering from these symptoms for approximately 3 days. Symptoms have been unchanged since their onset. Medications used in the past to treat these symptoms include beta agonist inhalers, antihistamines, mucinex, or robitussin. Suspected precipitants include infection and pollens.Patient has not required Emergency Room treatment for these  symptoms, and has not required hospitalization.    Outpatient Medications Prior to Visit  Medication Sig Dispense Refill  . acetaminophen (TYLENOL) 500 MG tablet Take 1,000 mg by mouth every 6 (six) hours as needed for mild pain, moderate pain or headache.    . ferrous sulfate 325 (65 FE) MG tablet Take 1 tablet (325 mg total) by mouth daily with breakfast. 90 tablet 1  . ibuprofen (ADVIL,MOTRIN) 200 MG tablet Take 600 mg by mouth every 6 (six) hours as needed for headache, mild pain or moderate pain.     . furosemide (LASIX) 20 MG tablet Take 1 daily for 10 days then as needed for swelling 30 tablet 0   No facility-administered medications prior to visit.     ROS Review of Systems  HENT: Positive for congestion, rhinorrhea and sinus pressure.   Eyes: Negative.   Respiratory: Positive for choking and wheezing.   Gastrointestinal: Negative.   Skin:       Swelling of the lower legs.  Psychiatric/Behavioral: Negative.     Objective:  BP (!) 141/99 (BP Location: Left Arm, Patient Position: Sitting, Cuff Size: Normal)   Pulse 85   Temp 98.3 F (36.8 C) (Oral)   Resp 18   Ht _0  (1.727 m)   Wt 236 lb (107 kg)   SpO2 98%   BMI 35.88 kg/m   BP/Weight 04/24/2017 6/54/6503 04/22/6567  Systolic BP 127 517 001  Diastolic BP 99 86 99  Wt. (Lbs) 236 230.4 -  BMI 35.88 35.03 -    Physical Exam  Constitutional: She appears well-developed and well-nourished.  Eyes: Conjunctivae are normal. Pupils are equal, round, and reactive to light.  Cardiovascular: Normal rate, regular rhythm,  normal heart sounds and intact distal pulses.   Pulmonary/Chest: Effort normal. She has wheezes.  Abdominal: Soft. Bowel sounds are normal.  Skin: Skin is warm and dry.  BLE greater in left than right. Non-pitting.  Nursing note and vitals reviewed.   Assessment & Plan:   Problem List Items Addressed This Visit      Respiratory   Asthma   Breathing treatment given in office   Relevant Medications     ipratropium-albuterol (DUONEB) 0.5-2.5 (3) MG/3ML nebulizer solution 3 mL   fluticasone (FLONASE) 50 MCG/ACT nasal spray   montelukast (SINGULAIR) 10 MG tablet   albuterol (PROVENTIL HFA;VENTOLIN HFA) 108 (90 Base) MCG/ACT inhaler   Other Relevant Orders   DG Chest 2 View (Completed)    Other Visit Diagnoses    Acute bronchitis, unspecified organism    -  Primary   Relevant Medications   azithromycin (ZITHROMAX) 250 MG tablet   fluticasone (FLONASE) 50 MCG/ACT nasal spray   albuterol (PROVENTIL HFA;VENTOLIN HFA) 108 (90 Base) MCG/ACT inhaler   Other Relevant Orders   DG Chest 2 View (Completed)   Lymphedema of both lower extremities       Relevant Medications   hydrochlorothiazide (HYDRODIURIL) 25 MG tablet   Other Relevant Orders   EKG 12-Lead   Brain natriuretic peptide (Completed)   ECHOCARDIOGRAM COMPLETE   Ambulatory referral to Vascular Surgery   Hypertension, unspecified type       Relevant Medications   hydrochlorothiazide (HYDRODIURIL) 25 MG tablet   Other Relevant Orders   EKG 12-Lead   CMP14+EGFR (Completed)   Lipid Panel (Completed)   Brain natriuretic peptide (Completed)   ECHOCARDIOGRAM COMPLETE   POCT UA - Microalbumin (Completed)   Troponin I   Follow up       Relevant Orders   ECHOCARDIOGRAM COMPLETE   Troponin I   Healthcare maintenance       Relevant Orders   Tdap vaccine greater than or equal to 7yo IM (Completed)      Meds ordered this encounter  Medications  . ipratropium-albuterol (DUONEB) 0.5-2.5 (3) MG/3ML nebulizer solution 3 mL  . hydrochlorothiazide (HYDRODIURIL) 25 MG tablet    Sig: Take 1 tablet (25 mg total) by mouth daily.    Dispense:  30 tablet    Refill:  2    Order Specific Question:   Supervising Provider    Answer:   Tresa Garter [id number]  . azithromycin (ZITHROMAX) 250 MG tablet    Sig: Take 2 tablets (500 mg total) by mouth day one. Then 1 tablet (250 mg total) by mouth daily.    Dispense:  6 tablet    Refill:   0    Order Specific Question:   Supervising Provider    Answer:   Tresa Garter [id number]  . fluticasone (FLONASE) 50 MCG/ACT nasal spray    Sig: Place 1 spray into both nostrils daily.    Dispense:  16 g    Refill:  6    Order Specific Question:   Supervising Provider    Answer:   Tresa Garter [id number]  . montelukast (SINGULAIR) 10 MG tablet    Sig: Take 1 tablet (10 mg total) by mouth at bedtime.    Dispense:  30 tablet    Refill:  6    Order Specific Question:   Supervising Provider    Answer:   Tresa Garter [id number]  . albuterol (PROVENTIL HFA;VENTOLIN HFA) 108 (90 Base) MCG/ACT inhaler    Sig: Inhale  2 puffs into the lungs every 6 (six) hours as needed for wheezing or shortness of breath.    Dispense:  1 Inhaler    Refill:  2    Order Specific Question:   Supervising Provider    Answer:   Tresa Garter [id number]    Follow-up: Return in about 2 weeks (around 05/08/2017) for PAP & HTN.   Alfonse Spruce FNP

## 2017-04-25 ENCOUNTER — Ambulatory Visit (HOSPITAL_COMMUNITY)
Admission: RE | Admit: 2017-04-25 | Discharge: 2017-04-25 | Disposition: A | Discharge status: Home / Self Care | Payer: Self-pay | Source: Ambulatory Visit | Attending: Family Medicine | Admitting: Family Medicine

## 2017-04-25 ENCOUNTER — Telehealth: Payer: Self-pay

## 2017-04-25 DIAGNOSIS — J4520 Mild intermittent asthma, uncomplicated: Secondary | ICD-10-CM

## 2017-04-25 DIAGNOSIS — J189 Pneumonia, unspecified organism: Secondary | ICD-10-CM | POA: Insufficient documentation

## 2017-04-25 DIAGNOSIS — J209 Acute bronchitis, unspecified: Secondary | ICD-10-CM

## 2017-04-25 LAB — CMP14+EGFR
A/G RATIO: 0.9 — AB (ref 1.2–2.2)
ALK PHOS: 73 IU/L (ref 39–117)
ALT: 12 IU/L (ref 0–32)
AST: 14 IU/L (ref 0–40)
Albumin: 3.7 g/dL (ref 3.5–5.5)
BUN/Creatinine Ratio: 16 (ref 9–23)
BUN: 8 mg/dL (ref 6–24)
Bilirubin Total: 0.3 mg/dL (ref 0.0–1.2)
CO2: 23 mmol/L (ref 18–29)
Calcium: 9 mg/dL (ref 8.7–10.2)
Chloride: 101 mmol/L (ref 96–106)
Creatinine, Ser: 0.51 mg/dL — ABNORMAL LOW (ref 0.57–1.00)
GFR calc Af Amer: 131 mL/min/{1.73_m2} (ref >59)
GFR calc non Af Amer: 113 mL/min/{1.73_m2} (ref >59)
GLOBULIN, TOTAL: 4.3 g/dL (ref 1.5–4.5)
Glucose: 101 mg/dL — ABNORMAL HIGH (ref 65–99)
POTASSIUM: 3.7 mmol/L (ref 3.5–5.2)
SODIUM: 140 mmol/L (ref 134–144)
Total Protein: 8 g/dL (ref 6.0–8.5)

## 2017-04-25 LAB — LIPID PANEL
CHOL/HDL RATIO: 2.4 ratio (ref 0.0–4.4)
CHOLESTEROL TOTAL: 178 mg/dL (ref 100–199)
HDL: 75 mg/dL (ref >39)
LDL Calculated: 85 mg/dL (ref 0–99)
TRIGLYCERIDES: 92 mg/dL (ref 0–149)
VLDL Cholesterol Cal: 18 mg/dL (ref 5–40)

## 2017-04-25 LAB — BRAIN NATRIURETIC PEPTIDE: BNP: 34.2 pg/mL (ref 0.0–100.0)

## 2017-04-25 NOTE — Telephone Encounter (Signed)
-----   Message from Lizbeth BarkMandesia R Hairston, FNP sent at 04/25/2017  3:17 PM EDT ----- Chest x-ray indicates you have pneumonia. Continue to take antibiotic as prescribed. Schedule in office follow up in 3 weeks, or sooner if symptoms do not improve.  Recommend repeat CXR in 3 weeks.

## 2017-04-25 NOTE — Telephone Encounter (Signed)
CMA call patient regarding lab results  Patient did not answer but left a detailed message regarding results & if have any questions just to call back

## 2017-04-26 NOTE — Telephone Encounter (Signed)
CMa call regarding lab results  Patient Verify DOB  Patient was aware and understood

## 2017-04-26 NOTE — Telephone Encounter (Signed)
-----   Message from [facility] Hairst[facility], FNP sent at 04/26/2017  1:50 PM EDT ----- Kidney function normal Liver function normal BNP is normal. This can be elevated with heart failure.

## 2017-05-03 ENCOUNTER — Ambulatory Visit (HOSPITAL_COMMUNITY): Admission: RE | Admit: 2017-05-03 | Payer: Self-pay | Source: Ambulatory Visit

## 2017-05-08 ENCOUNTER — Encounter: Payer: Self-pay | Admitting: *Deleted

## 2017-05-12 ENCOUNTER — Encounter: Payer: Self-pay | Admitting: Vascular Surgery

## 2017-05-29 ENCOUNTER — Encounter: Payer: Self-pay | Admitting: Vascular Surgery

## 2017-06-07 ENCOUNTER — Encounter: Payer: Self-pay | Admitting: Vascular Surgery

## 2017-06-26 ENCOUNTER — Other Ambulatory Visit: Payer: Self-pay | Admitting: *Deleted

## 2017-06-26 DIAGNOSIS — J209 Acute bronchitis, unspecified: Secondary | ICD-10-CM

## 2017-06-26 DIAGNOSIS — J4520 Mild intermittent asthma, uncomplicated: Secondary | ICD-10-CM

## 2017-06-26 MED ORDER — ALBUTEROL SULFATE HFA 108 (90 BASE) MCG/ACT IN AERS: 2.0000 | INHALATION_SPRAY | Freq: Four times a day (QID) | RESPIRATORY_TRACT | 3 refills | Status: DC | PRN

## 2017-06-26 NOTE — Telephone Encounter (Signed)
PRINTED FOR PASS PROGRAM 

## 2017-08-07 ENCOUNTER — Ambulatory Visit: Payer: Self-pay | Admitting: Family Medicine

## 2017-08-09 ENCOUNTER — Ambulatory Visit: Payer: Self-pay | Admitting: Family Medicine

## 2018-01-04 ENCOUNTER — Ambulatory Visit: Payer: Self-pay | Admitting: Family Medicine

## 2018-01-04 NOTE — Progress Notes (Deleted)
   Subjective:  Patient ID: Brittany Beasley, female    DOB: 02/25/1966  Age: 52 y.o. MRN: [id number]  CC: No chief complaint on file.   HPI Brittany Beasley presents for   Outpatient Medications Prior to Visit  Medication Sig Dispense Refill  . acetaminophen (TYLENOL) 500 MG tablet Take 1,000 mg by mouth every 6 (six) hours as needed for mild pain, moderate pain or headache.    . albuterol (PROVENTIL HFA;VENTOLIN HFA) 108 (90 Base) MCG/ACT inhaler Inhale 2 puffs into the lungs every 6 (six) hours as needed for wheezing or shortness of breath. 54 g 3  . azithromycin (ZITHROMAX) 250 MG tablet Take 2 tablets (500 mg total) by mouth day one. Then 1 tablet (250 mg total) by mouth daily. 6 tablet 0  . ferrous sulfate 325 (65 FE) MG tablet Take 1 tablet (325 mg total) by mouth daily with breakfast. 90 tablet 1  . fluticasone (FLONASE) 50 MCG/ACT nasal spray Place 1 spray into both nostrils daily. 16 g 6  . hydrochlorothiazide (HYDRODIURIL) 25 MG tablet Take 1 tablet (25 mg total) by mouth daily. 30 tablet 2  . ibuprofen (ADVIL,MOTRIN) 200 MG tablet Take 600 mg by mouth every 6 (six) hours as needed for headache, mild pain or moderate pain.     . montelukast (SINGULAIR) 10 MG tablet Take 1 tablet (10 mg total) by mouth at bedtime. 30 tablet 6   Facility-Administered Medications Prior to Visit  Medication Dose Route Frequency Provider Last Rate Last Dose  . ipratropium-albuterol (DUONEB) 0.5-2.5 (3) MG/3ML nebulizer solution 3 mL  3 mL Nebulization Q20 Min PRN Hairston, Mandesia R, FNP        ROS Review of Systems  Constitutional: Negative.   Respiratory: Negative.   Cardiovascular: Negative.   Gastrointestinal: Negative.   Musculoskeletal:       Leg swelling  Skin: Negative.         Objective:  There were no vitals taken for this visit.  BP/Weight 04/24/2017 03/31/2017 03/25/2017  Systolic BP 141 149 156  Diastolic BP 99 86 99  Wt. (Lbs) 236 230.4 -  BMI 35.88 35.03 -      Physical Exam  Constitutional: She appears well-developed and well-nourished.  Eyes: Conjunctivae are normal. Pupils are equal, round, and reactive to light.  Neck: No JVD present.  Cardiovascular: Normal rate, regular rhythm, normal heart sounds and intact distal pulses.  Pulmonary/Chest: Effort normal and breath sounds normal.  Abdominal: Soft. Bowel sounds are normal.  Skin: Skin is warm and dry.  Nursing note and vitals reviewed.    Assessment & Plan:   There are no diagnoses linked to this encounter.     Follow-up: No Follow-up on file.   Lizbeth BarkMandesia R Hairston FNP

## 2020-07-29 ENCOUNTER — Emergency Department (HOSPITAL_COMMUNITY): Payer: Self-pay

## 2020-07-29 ENCOUNTER — Other Ambulatory Visit: Payer: Self-pay

## 2020-07-29 ENCOUNTER — Encounter (HOSPITAL_COMMUNITY): Payer: Self-pay

## 2020-07-29 DIAGNOSIS — R2243 Localized swelling, mass and lump, lower limb, bilateral: Secondary | ICD-10-CM | POA: Insufficient documentation

## 2020-07-29 DIAGNOSIS — J45909 Unspecified asthma, uncomplicated: Secondary | ICD-10-CM | POA: Insufficient documentation

## 2020-07-29 DIAGNOSIS — Z87891 Personal history of nicotine dependence: Secondary | ICD-10-CM | POA: Insufficient documentation

## 2020-07-29 NOTE — ED Triage Notes (Signed)
Arrived POV from home. Patient reports left leg and foot swelling that has gotten worse over last 4-5 days. Patient states she had some swelling a few years ago, but it has never been this bad. Patient says she can usually soak her leg and foot and Epsom salt and pain ans swelling will subside, but it has not helped this time

## 2020-07-30 ENCOUNTER — Emergency Department (HOSPITAL_BASED_OUTPATIENT_CLINIC_OR_DEPARTMENT_OTHER)
Admission: RE | Admit: 2020-07-30 | Discharge: 2020-07-30 | Disposition: A | Discharge status: Home / Self Care | Payer: Self-pay | Source: Ambulatory Visit | Attending: Emergency Medicine | Admitting: Emergency Medicine

## 2020-07-30 ENCOUNTER — Emergency Department (HOSPITAL_COMMUNITY)
Admission: EM | Admit: 2020-07-30 | Discharge: 2020-07-30 | Disposition: A | Discharge status: Home / Self Care | Payer: Self-pay | Attending: Emergency Medicine | Admitting: Emergency Medicine

## 2020-07-30 DIAGNOSIS — M7989 Other specified soft tissue disorders: Secondary | ICD-10-CM

## 2020-07-30 DIAGNOSIS — R609 Edema, unspecified: Secondary | ICD-10-CM

## 2020-07-30 DIAGNOSIS — R0602 Shortness of breath: Secondary | ICD-10-CM

## 2020-07-30 LAB — COMPREHENSIVE METABOLIC PANEL
ALT: 18 U/L (ref 0–44)
AST: 13 U/L — ABNORMAL LOW (ref 15–41)
Albumin: 3.4 g/dL — ABNORMAL LOW (ref 3.5–5.0)
Alkaline Phosphatase: 64 U/L (ref 38–126)
Anion gap: 8 (ref 5–15)
BUN: 14 mg/dL (ref 6–20)
CO2: 27 mmol/L (ref 22–32)
Calcium: 9.2 mg/dL (ref 8.9–10.3)
Chloride: 103 mmol/L (ref 98–111)
Creatinine, Ser: 0.62 mg/dL (ref 0.44–1.00)
GFR calc Af Amer: >60 mL/min (ref >60)
GFR calc non Af Amer: >60 mL/min (ref >60)
Glucose, Bld: 114 mg/dL — ABNORMAL HIGH (ref 70–99)
Potassium: 4.3 mmol/L (ref 3.5–5.1)
Sodium: 138 mmol/L (ref 135–145)
Total Bilirubin: 0.5 mg/dL (ref 0.3–1.2)
Total Protein: 9.3 g/dL — ABNORMAL HIGH (ref 6.5–8.1)

## 2020-07-30 LAB — CBC WITH DIFFERENTIAL/PLATELET
Abs Immature Granulocytes: 0.06 10*3/uL (ref 0.00–0.07)
Basophils Absolute: 0 10*3/uL (ref 0.0–0.1)
Basophils Relative: 1 %
Eosinophils Absolute: 0.2 10*3/uL (ref 0.0–0.5)
Eosinophils Relative: 2 %
HCT: 31.5 % — ABNORMAL LOW (ref 36.0–46.0)
Hemoglobin: 9.7 g/dL — ABNORMAL LOW (ref 12.0–15.0)
Immature Granulocytes: 1 %
Lymphocytes Relative: 17 %
Lymphs Abs: 1.3 10*3/uL (ref 0.7–4.0)
MCH: 28.6 pg (ref 26.0–34.0)
MCHC: 30.8 g/dL (ref 30.0–36.0)
MCV: 92.9 fL (ref 80.0–100.0)
Monocytes Absolute: 0.7 10*3/uL (ref 0.1–1.0)
Monocytes Relative: 9 %
Neutro Abs: 5.7 10*3/uL (ref 1.7–7.7)
Neutrophils Relative %: 70 %
Platelets: 399 10*3/uL (ref 150–400)
RBC: 3.39 MIL/uL — ABNORMAL LOW (ref 3.87–5.11)
RDW: 16.6 % — ABNORMAL HIGH (ref 11.5–15.5)
WBC: 8 10*3/uL (ref 4.0–10.5)
nRBC: 0 % (ref 0.0–0.2)

## 2020-07-30 LAB — D-DIMER, QUANTITATIVE: D-Dimer, Quant: 2.21 ug/mL-FEU — ABNORMAL HIGH (ref 0.00–0.50)

## 2020-07-30 MED ORDER — ENOXAPARIN SODIUM 100 MG/ML ~~LOC~~ SOLN
100.0000 mg | SUBCUTANEOUS | Status: AC
  Administered 2020-07-30: 100 mg via SUBCUTANEOUS
  Filled 2020-07-30: qty 1

## 2020-07-30 MED ORDER — OXYCODONE-ACETAMINOPHEN 5-325 MG PO TABS: 1.0000 | ORAL_TABLET | Freq: Four times a day (QID) | ORAL | 0 refills | Status: DC | PRN

## 2020-07-30 NOTE — ED Provider Notes (Signed)
Emergency Department Provider Note   I have reviewed the triage vital signs and the nursing notes.   HISTORY  Chief Complaint Leg Swelling and Foot Swelling   HPI Brittany Beasley is a 54 y.o. female with past medical history of left lower extremity lymphedema presents to the emergency department with worsening swelling and pain in the left lower extremity.  She is not experiencing chest pain or shortness of breath.  Symptoms have been worsening over the past week but she notes chronic swelling in this leg.  She has tried using the compression stockings but the leg is large to the point where she has difficulty with this.  She is not having fevers or chills.  No rashes.  No radiation of symptoms or other modifying factors.  Past Medical History:  Diagnosis Date  . Asthma   . Lymphedema 08/17/2016   LLE following cellulitis 06/2016    Patient Active Problem List   Diagnosis Date Noted  . Asthma 04/24/2017  . Lymphedema 08/17/2016    Past Surgical History:  Procedure Laterality Date  . TUBAL LIGATION      Allergies Patient has no known allergies.  No family history on file.  Social History Social History   Tobacco Use  . Smoking status: Former Games developer  . Smokeless tobacco: Never Used  Substance Use Topics  . Alcohol use: Yes    Comment: ocassionally  . Drug use: No    Review of Systems  Constitutional: No fever/chills Eyes: No visual changes. ENT: No sore throat. Cardiovascular: Denies chest pain. Respiratory: Denies shortness of breath. Gastrointestinal: No abdominal pain.  No nausea, no vomiting.  No diarrhea.  No constipation. Genitourinary: Negative for dysuria. Musculoskeletal: Negative for back pain. Positive left leg swelling and pain.  Skin: Negative for rash. Neurological: Negative for headaches, focal weakness or numbness.  10-point ROS otherwise negative.  ____________________________________________   PHYSICAL EXAM:  VITAL SIGNS: ED  Triage Vitals [07/29/20 2319]  Enc Vitals Group     BP (!) 154/98     Pulse Rate 92     Resp 17     Temp 97.9 F (36.6 C)     Temp Source Oral     SpO2 98 %     Weight 225 lb (102.1 kg)     Height 5\' 9"  (1.753 m)   Constitutional: Alert and oriented. Well appearing and in no acute distress. Eyes: Conjunctivae are normal.  Head: Atraumatic. Nose: No congestion/rhinnorhea. Mouth/Throat: Mucous membranes are moist. Neck: No stridor. Cardiovascular: Normal rate, regular rhythm. Good peripheral circulation. Grossly normal heart sounds.   Respiratory: Normal respiratory effort.  No retractions. Lungs CTAB. Gastrointestinal: Soft and nontender. No distention.  Musculoskeletal: Very swollen LLE compared to the right without cellulitis or bruising. No weeping. No ulcerations. Leg not warm to touch.  Neurologic:  Normal speech and language. Skin:  Skin is warm, dry and intact. No rash noted.  ____________________________________________   LABS (all labs ordered are listed, but only abnormal results are displayed)  Labs Reviewed  COMPREHENSIVE METABOLIC PANEL - Abnormal; Notable for the following components:      Result Value   Glucose, Bld 114 (*)    Total Protein 9.3 (*)    Albumin 3.4 (*)    AST 13 (*)    All other components within normal limits  CBC WITH DIFFERENTIAL/PLATELET - Abnormal; Notable for the following components:   RBC 3.39 (*)    Hemoglobin 9.7 (*)    HCT 31.5 (*)  RDW 16.6 (*)    All other components within normal limits  D-DIMER, QUANTITATIVE (NOT AT [facility]) - Abnormal; Notable for the following components:   D-Dimer, Quant 2.21 (*)    All other components within normal limits   ____________________________________________  RADIOLOGY  DG Chest 2 View  Result Date: 07/29/2020 CLINICAL DATA:  54 year old female with progressive lower extremity swelling. EXAM: CHEST - 2 VIEW COMPARISON:  Chest radiographs 05/05/2017 and earlier. FINDINGS: Lower lung volumes  on the lateral today. Cardiac size at the upper limits of normal. Other mediastinal contours are within normal limits. Visualized tracheal air column is within normal limits. No pneumothorax, pulmonary edema, pleural effusion or confluent pulmonary opacity. Mild chronic coarse pulmonary interstitial markings appear stable compared to 2017. Mild scoliosis. No acute osseous abnormality identified. Negative visible bowel gas pattern. IMPRESSION: No acute cardiopulmonary abnormality. Electronically Signed   By: Odessa Fleming M.D.   On: 07/29/2020 23:59    ____________________________________________   PROCEDURES  Procedure(s) performed:   Procedures  None  ____________________________________________   INITIAL IMPRESSION / ASSESSMENT AND PLAN / ED COURSE  Pertinent labs & imaging results that were available during my care of the patient were reviewed by me and considered in my medical decision making (see chart for details).   Major presents emergency department with left leg swelling and pain.  She has known history of lymphedema on the left.  No ED evaluation since 2018.  Her D-dimer is elevated in the setting of worsening leg pain and swelling.  Plan to send for DVT study in the morning.  I do not have DVT study available at this hour.  She is having pain and so sent home with a small amount of pain medication.  Patient given Lovenox in the ED prior to discharge to bridge until her a.m. DVT ultrasound.  Have also listed the name of her primary care doctor which the patient will call in the morning. Discussed ED return precautions. No CP or SOB symptoms to suspect PE.   I reviewed the [place] drug database prior to narcotic Rx.  ____________________________________________  FINAL CLINICAL IMPRESSION(S) / ED DIAGNOSES  Final diagnoses:  Leg swelling     MEDICATIONS GIVEN DURING THIS VISIT:  Lovenox SQ  NEW OUTPATIENT MEDICATIONS STARTED DURING THIS VISIT:  New Prescriptions    OXYCODONE-ACETAMINOPHEN (PERCOCET/ROXICET) 5-325 MG TABLET    Take 1 tablet by mouth every 6 (six) hours as needed for severe pain.    Note:  This document was prepared using Dragon voice recognition software and may include unintentional dictation errors.  Alona Bene, MD, [facility]    Dwayn Moravek, Arlyss Repress, MD 07/30/20 [phone]

## 2020-07-30 NOTE — Progress Notes (Signed)
VASCULAR LAB    Left lower extremity venous duplex completed.    Preliminary report:  See CV proc for preliminary results.  Harve Spradley, RVT 07/30/2020, 11:23 AM   ;

## 2020-07-30 NOTE — Discharge Instructions (Addendum)
IMPORTANT PATIENT INSTRUCTIONS:  You have been scheduled for an Outpatient Vascular Study at [facility].    If tomorrow is a Saturday, Sunday or holiday, please go to the [place] [facility] Registration Desk at 11 am tomorrow morning and tell them you are there for a vascular study.   If tomorrow is a weekday (Monday-Friday), please go to  [facility]ospital En[facility] Registration at 11 am and tell them you are there for a vascular study. 

## 2020-08-19 NOTE — Progress Notes (Signed)
Patient ID: Brittany Beasley, female   DOB: Apr 20, 1966, 54 y.o.   MRN: [id number]     Brittany Beasley, is a 54 y.o. female  SLH:[id number]  [id number]:[id number]  DOB - [birth year]  Subjective:  Chief Complaint and HPI: Brittany Beasley is a 54 y.o. female here today to establish care and for a follow up visit After ED visit for leg swelling 07/30/2020.  Doppler study was negative for DVT.  No SOB.  Her left leg has been swelling now on and off for several years.  Her R leg swells occasionally, but the L is a chronic and ongoing issue.  It does improve somewhat overnight.  She denies any previous L sided abdominal or LN surgeries that precipitated the issue.  NKI to her LL extremity.  She was previously on HCTZ for htn and it did seem to help.  She denies CP.  She does not know what all types of work-up she has had on this previously.  Moved her from [place].  The L leg does cause her pain  From ED note: [name] presents emergency department with left leg swelling and pain.  She has known history of lymphedema on the left.  No ED evaluation since 2018.  Her D-dimer is elevated in the setting of worsening leg pain and swelling.  Plan to send for DVT study in the morning.  I do not have DVT study available at this hour.  She is having pain and so sent home with a small amount of pain medication.  Patient given Lovenox in the ED prior to discharge to bridge until her a.m. DVT ultrasound.  Have also listed the name of her primary care doctor which the patient will call in the morning. Discussed ED return precautions. No CP or SOB symptoms to suspect PE.   I reviewed the [place] drug database prior to narcotic Rx  ED/Hospital notes reviewed and summarized above.    ROS:   Constitutional:  No f/c, No night sweats, No unexplained weight loss. EENT:  No vision changes, No blurry vision, No hearing changes. No mouth, throat, or ear problems.  Respiratory: No cough, No SOB Cardiac: No CP, no palpitations GI:  No  abd pain, No N/V/D. GU: No Urinary s/sx Musculoskeletal: +L leg pain Neuro: No headache, no dizziness, no motor weakness.  Skin: No rash Endocrine:  No polydipsia. No polyuria.  Psych: Denies SI/HI  No problems updated.  ALLERGIES: No Known Allergies  PAST MEDICAL HISTORY: Past Medical History:  Diagnosis Date  . Asthma   . Lymphedema 08/17/2016   LLE following cellulitis 06/2016    MEDICATIONS AT HOME: Prior to Admission medications   Medication Sig Start Date End Date Taking? Authorizing Provider  acetaminophen (TYLENOL) 500 MG tablet Take 1,000 mg by mouth every 6 (six) hours as needed for mild pain, moderate pain or headache.    [provider]  albuterol (PROVENTIL HFA;VENTOLIN HFA) 108 (90 Base) MCG/ACT inhaler Inhale 2 puffs into the lungs every 6 (six) hours as needed for wheezing or shortness of breath. 06/26/17   Quentin Angst, MD  azithromycin (ZITHROMAX) 250 MG tablet Take 2 tablets (500 mg total) by mouth day one. Then 1 tablet (250 mg total) by mouth daily. Patient not taking: Reported on 08/20/2020 04/24/17   Lizbeth Bark, FNP  ferrous sulfate 325 (65 FE) MG tablet Take 1 tablet (325 mg total) by mouth daily with breakfast. 08/20/20   Anders Simmonds, PA-C  fluticasone (FLONASE) 50 MCG/ACT nasal  spray Place 1 spray into both nostrils daily. 04/24/17   Lizbeth Bark, FNP  furosemide (LASIX) 20 MG tablet Take 1 tablet (20 mg total) by mouth daily. X 1 week 08/20/20   Anders Simmonds, PA-C  hydrochlorothiazide (HYDRODIURIL) 25 MG tablet Take 1 tablet (25 mg total) by mouth daily. 08/20/20   Anders Simmonds, PA-C  ibuprofen (ADVIL,MOTRIN) 200 MG tablet Take 600 mg by mouth every 6 (six) hours as needed for headache, mild pain or moderate pain.  Patient not taking: Reported on 08/20/2020    [provider]  montelukast (SINGULAIR) 10 MG tablet Take 1 tablet (10 mg total) by mouth at bedtime. Patient not taking: Reported on 08/20/2020 04/24/17    Lizbeth Bark, FNP  oxyCODONE-acetaminophen (PERCOCET/ROXICET) 5-325 MG tablet Take 1 tablet by mouth every 6 (six) hours as needed for severe pain. Patient not taking: Reported on 08/20/2020 07/30/20   Maia Plan, MD     Objective:  EXAM:   Vitals:   08/20/20 1346  BP: (!) 154/86  Pulse: 80  SpO2: 99%  Weight: 228 lb (103.4 kg)  Height: 5\' 9"  (1.753 m)    General appearance : A&OX3. NAD. Non-toxic-appearing HEENT: Atraumatic and Normocephalic.  PERRLA. EOM intact.   Chest/Lungs:  Breathing-non-labored, Good air entry bilaterally, breath sounds normal without rales, rhonchi, or wheezing  CVS: S1 S2 regular, no murmurs, gallops, rubs . Extremities: R Lower Ext shows no edema, both legs are warm to touch with = pulse throughout L leg with marked pitting edema 3+, no oozing/weeping.  No erythema of the calf.  No induration.  Neg Homan's B.   Neurology:  CN II-XII grossly intact, Non focal.   Psych:  TP linear. J/I WNL. Normal speech. Appropriate eye contact and affect.  Skin:  No Rash  Data Review Lab Results  Component Value Date   HGBA1C 5.3 08/20/2020     Assessment & Plan   1. Hyperglycemia No diabetes.  I have had a lengthy discussion and provided education about insulin resistance and the intake of too much sugar/refined carbohydrates.  I have advised the patient to work at a goal of eliminating sugary drinks, candy, desserts, sweets, refined sugars, processed foods, and white carbohydrates.  The patient expresses understanding.  - Glucose (CBG) - HgB A1c - Comprehensive metabolic panel  2. Lymphedema Unsure etiology - Brain natriuretic peptide - Thyroid Panel With TSH - Vitamin D, 25-hydroxy - CBC with Differential/Platelet - Comprehensive metabolic panel  3. Anemia, unspecified type - Brain natriuretic peptide - Thyroid Panel With TSH - CBC with Differential/Platelet - Iron, TIBC and Ferritin Panel - ferrous sulfate 325 (65 FE) MG tablet; Take 1  tablet (325 mg total) by mouth daily with breakfast.  Dispense: 90 tablet; Refill: 1  4. Lymphedema of L lower extremitie - hydrochlorothiazide (HYDRODIURIL) 25 MG tablet; Take 1 tablet (25 mg total) by mouth daily.  Dispense: 30 tablet; Refill: 2 Lasix 20mg  daily X 7 days.  1 RF  5. Hypertension, unspecified type - hydrochlorothiazide (HYDRODIURIL) 25 MG tablet; Take 1 tablet (25 mg total) by mouth daily.  Dispense: 30 tablet; Refill: 2 Check BP OOO 3-4 times weekly and record and bring to next f/up  Patient have been counseled extensively about nutrition and exercise  Return in about 1 month (around 09/19/2020) for assign PCP;  recheck BP L leg swelling.  The patient was given clear instructions to go to ER or return to medical center if symptoms don't improve, worsen  or new problems develop. The patient verbalized understanding. The patient was told to call to get lab results if they haven't heard anything in the next week.     Georgian Co, PA-C [facility] [place], [place] [phone]   08/20/2020, 3:46 PM

## 2020-08-20 ENCOUNTER — Encounter: Payer: Self-pay | Admitting: Physician Assistant

## 2020-08-20 ENCOUNTER — Other Ambulatory Visit: Payer: Self-pay

## 2020-08-20 ENCOUNTER — Ambulatory Visit: Payer: Self-pay | Attending: Physician Assistant | Admitting: Physician Assistant

## 2020-08-20 VITALS — BP 154/86 | HR 80 | Ht 69.0 in | Wt 228.0 lb

## 2020-08-20 DIAGNOSIS — D649 Anemia, unspecified: Secondary | ICD-10-CM

## 2020-08-20 DIAGNOSIS — R739 Hyperglycemia, unspecified: Secondary | ICD-10-CM

## 2020-08-20 DIAGNOSIS — I890 Lymphedema, not elsewhere classified: Secondary | ICD-10-CM

## 2020-08-20 DIAGNOSIS — I10 Essential (primary) hypertension: Secondary | ICD-10-CM

## 2020-08-20 LAB — POCT GLYCOSYLATED HEMOGLOBIN (HGB A1C): HbA1c, POC (controlled diabetic range): 5.3 % (ref 0.0–7.0)

## 2020-08-20 LAB — GLUCOSE, POCT (MANUAL RESULT ENTRY): POC Glucose: 94 mg/dl (ref 70–99)

## 2020-08-20 MED ORDER — FUROSEMIDE 20 MG PO TABS
20.0000 mg | ORAL_TABLET | Freq: Every day | ORAL | 1 refills | Status: DC
Start: 2020-08-20 — End: 2020-10-14

## 2020-08-20 MED ORDER — HYDROCHLOROTHIAZIDE 25 MG PO TABS: 25.0000 mg | ORAL_TABLET | Freq: Every day | ORAL | 2 refills | Status: DC

## 2020-08-20 MED ORDER — FERROUS SULFATE 325 (65 FE) MG PO TABS: 325.0000 mg | ORAL_TABLET | Freq: Every day | ORAL | 1 refills | Status: DC

## 2020-08-20 NOTE — Progress Notes (Signed)
Pain in left leg. ?

## 2020-08-20 NOTE — Patient Instructions (Addendum)
Check blood pressure 3 times weekly and record and bring to next visit   Edema  Edema is when you have too much fluid in your body or under your skin. Edema may make your legs, feet, and ankles swell up. Swelling is also common in looser tissues, like around your eyes. This is a common condition. It gets more common as you get older. There are many possible causes of edema. Eating too much salt (sodium) and being on your feet or sitting for a long time can cause edema in your legs, feet, and ankles. Hot weather may make edema worse. Edema is usually painless. Your skin may look swollen or shiny. Follow these instructions at home:  Keep the swollen body part raised (elevated) above the level of your heart when you are sitting or lying down.  Do not sit still or stand for a long time.  Do not wear tight clothes. Do not wear garters on your upper legs.  Exercise your legs. This can help the swelling go down.  Wear elastic bandages or support stockings as told by your doctor.  Eat a low-salt (low-sodium) diet to reduce fluid as told by your doctor.  Depending on the cause of your swelling, you may need to limit how much fluid you drink (fluid restriction).  Take over-the-counter and prescription medicines only as told by your doctor. Contact a doctor if:  Treatment is not working.  You have heart, liver, or kidney disease and have symptoms of edema.  You have sudden and unexplained weight gain. Get help right away if:  You have shortness of breath or chest pain.  You cannot breathe when you lie down.  You have pain, redness, or warmth in the swollen areas.  You have heart, liver, or kidney disease and get edema all of a sudden.  You have a fever and your symptoms get worse all of a sudden. Summary  Edema is when you have too much fluid in your body or under your skin.  Edema may make your legs, feet, and ankles swell up. Swelling is also common in looser tissues, like around  your eyes.  Raise (elevate) the swollen body part above the level of your heart when you are sitting or lying down.  Follow your doctor's instructions about diet and how much fluid you can drink (fluid restriction). This information is not intended to replace advice given to you by your health care provider. Make sure you discuss any questions you have with your health care provider. Document Revised: 12/08/2017 Document Reviewed: 12/23/2016 Elsevier Patient Education  2020 [employer].

## 2020-08-21 LAB — CBC WITH DIFFERENTIAL/PLATELET
Basophils Absolute: 0 10*3/uL (ref 0.0–0.2)
Basos: 1 %
EOS (ABSOLUTE): 0.2 10*3/uL (ref 0.0–0.4)
Eos: 4 %
Hematocrit: 31.6 % — ABNORMAL LOW (ref 34.0–46.6)
Hemoglobin: 10 g/dL — ABNORMAL LOW (ref 11.1–15.9)
Immature Grans (Abs): 0 10*3/uL (ref 0.0–0.1)
Immature Granulocytes: 0 %
Lymphocytes Absolute: 1.6 10*3/uL (ref 0.7–3.1)
Lymphs: 32 %
MCH: 28.6 pg (ref 26.6–33.0)
MCHC: 31.6 g/dL (ref 31.5–35.7)
MCV: 90 fL (ref 79–97)
Monocytes Absolute: 0.6 10*3/uL (ref 0.1–0.9)
Monocytes: 12 %
Neutrophils Absolute: 2.6 10*3/uL (ref 1.4–7.0)
Neutrophils: 51 %
Platelets: 290 10*3/uL (ref 150–450)
RBC: 3.5 x10E6/uL — ABNORMAL LOW (ref 3.77–5.28)
RDW: 16.6 % — ABNORMAL HIGH (ref 11.7–15.4)
WBC: 5 10*3/uL (ref 3.4–10.8)

## 2020-08-21 LAB — COMPREHENSIVE METABOLIC PANEL
ALT: 16 IU/L (ref 0–32)
AST: 25 IU/L (ref 0–40)
Albumin/Globulin Ratio: 0.9 — ABNORMAL LOW (ref 1.2–2.2)
Albumin: 4 g/dL (ref 3.8–4.9)
Alkaline Phosphatase: 78 IU/L (ref 48–121)
BUN/Creatinine Ratio: 22 (ref 9–23)
BUN: 13 mg/dL (ref 6–24)
Bilirubin Total: 0.3 mg/dL (ref 0.0–1.2)
CO2: 24 mmol/L (ref 20–29)
Calcium: 9.3 mg/dL (ref 8.7–10.2)
Chloride: 103 mmol/L (ref 96–106)
Creatinine, Ser: 0.58 mg/dL (ref 0.57–1.00)
GFR calc Af Amer: 123 mL/min/{1.73_m2} (ref >59)
GFR calc non Af Amer: 106 mL/min/{1.73_m2} (ref >59)
Globulin, Total: 4.6 g/dL — ABNORMAL HIGH (ref 1.5–4.5)
Glucose: 86 mg/dL (ref 65–99)
Potassium: 3.9 mmol/L (ref 3.5–5.2)
Sodium: 139 mmol/L (ref 134–144)
Total Protein: 8.6 g/dL — ABNORMAL HIGH (ref 6.0–8.5)

## 2020-08-21 LAB — IRON,TIBC AND FERRITIN PANEL
Ferritin: 22 ng/mL (ref 15–150)
Iron Saturation: 9 % — CL (ref 15–55)
Iron: 33 ug/dL (ref 27–159)
Total Iron Binding Capacity: 382 ug/dL (ref 250–450)
UIBC: 349 ug/dL (ref 131–425)

## 2020-08-21 LAB — THYROID PANEL WITH TSH
Free Thyroxine Index: 1.5 (ref 1.2–4.9)
T3 Uptake Ratio: 28 % (ref 24–39)
T4, Total: 5.4 ug/dL (ref 4.5–12.0)
TSH: 1.8 u[IU]/mL (ref 0.450–4.500)

## 2020-08-21 LAB — VITAMIN D 25 HYDROXY (VIT D DEFICIENCY, FRACTURES): Vit D, 25-Hydroxy: 50.6 ng/mL (ref 30.0–100.0)

## 2020-08-21 LAB — BRAIN NATRIURETIC PEPTIDE: BNP: 33.3 pg/mL (ref 0.0–100.0)

## 2020-09-23 ENCOUNTER — Ambulatory Visit: Payer: Self-pay | Admitting: Physician Assistant

## 2020-10-07 ENCOUNTER — Ambulatory Visit: Payer: Self-pay | Admitting: Family Medicine

## 2020-10-14 ENCOUNTER — Other Ambulatory Visit: Payer: Self-pay

## 2020-10-14 ENCOUNTER — Encounter: Payer: Self-pay | Admitting: Family Medicine

## 2020-10-14 ENCOUNTER — Ambulatory Visit: Payer: Self-pay | Attending: Family Medicine | Admitting: Family Medicine

## 2020-10-14 VITALS — BP 143/89 | HR 114 | Temp 97.3°F | Ht 69.0 in | Wt 228.2 lb

## 2020-10-14 DIAGNOSIS — Z1211 Encounter for screening for malignant neoplasm of colon: Secondary | ICD-10-CM

## 2020-10-14 DIAGNOSIS — R011 Cardiac murmur, unspecified: Secondary | ICD-10-CM

## 2020-10-14 DIAGNOSIS — I890 Lymphedema, not elsewhere classified: Secondary | ICD-10-CM

## 2020-10-14 DIAGNOSIS — I10 Essential (primary) hypertension: Secondary | ICD-10-CM

## 2020-10-14 DIAGNOSIS — K5909 Other constipation: Secondary | ICD-10-CM

## 2020-10-14 DIAGNOSIS — D500 Iron deficiency anemia secondary to blood loss (chronic): Secondary | ICD-10-CM

## 2020-10-14 MED ORDER — HYDROCHLOROTHIAZIDE 25 MG PO TABS: 25.0000 mg | ORAL_TABLET | Freq: Every day | ORAL | 1 refills | Status: DC

## 2020-10-14 MED ORDER — DOCUSATE SODIUM 100 MG PO CAPS: 100.0000 mg | ORAL_CAPSULE | Freq: Every day | ORAL | 3 refills | Status: DC

## 2020-10-14 MED ORDER — FUROSEMIDE 20 MG PO TABS: 20.0000 mg | ORAL_TABLET | Freq: Every day | ORAL | 1 refills | Status: DC

## 2020-10-14 NOTE — Progress Notes (Signed)
Established Patient Office Visit  Subjective:  Patient ID: Brittany Beasley, female    DOB: 03-28-1966  Age: 54 y.o. MRN: [id number]  CC:  Chief Complaint  Patient presents with  . Follow-up    HPI Brittany Beasley, 54 year old female seen in follow-up of hypertension and patient with chronic issues with lymphedema.  She reports that she has never seen a vascular specialist in follow-up of her chronic leg swelling.  She reports that the swelling in her legs is worse at the end of the workday.  She does take her blood pressure medication on a daily basis and denies any recent issues with headaches or dizziness related to her blood pressure.  She has had some constipation but is also currently on iron therapy due to history of anemia.  Past Medical History:  Diagnosis Date  . Asthma   . Lymphedema 08/17/2016   LLE following cellulitis 06/2016    Past Surgical History:  Procedure Laterality Date  . TUBAL LIGATION      History reviewed. No pertinent family history.  Social History   Socioeconomic History  . Marital status: Single    Spouse name: Not on file  . Number of children: Not on file  . Years of education: Not on file  . Highest education level: Not on file  Occupational History  . Not on file  Tobacco Use  . Smoking status: Former Games developer  . Smokeless tobacco: Never Used  Substance and Sexual Activity  . Alcohol use: Yes    Comment: ocassionally  . Drug use: No  . Sexual activity: Yes    Birth control/protection: Surgical  Other Topics Concern  . Not on file  Social History Narrative  . Not on file   Social Determinants of Health   Financial Resource Strain:   . Difficulty of Paying Living Expenses: Not on file  Food Insecurity:   . Worried About Programme researcher, broadcasting/film/video in the Last Year: Not on file  . Ran Out of Food in the Last Year: Not on file  Transportation Needs:   . Lack of Transportation (Medical): Not on file  . Lack of Transportation  (Non-Medical): Not on file  Physical Activity:   . Days of Exercise per Week: Not on file  . Minutes of Exercise per Session: Not on file  Stress:   . Feeling of Stress : Not on file  Social Connections:   . Frequency of Communication with Friends and Family: Not on file  . Frequency of Social Gatherings with Friends and Family: Not on file  . Attends Religious Services: Not on file  . Active Member of Clubs or Organizations: Not on file  . Attends Banker Meetings: Not on file  . Marital Status: Not on file  Intimate Partner Violence:   . Fear of Current or Ex-Partner: Not on file  . Emotionally Abused: Not on file  . Physically Abused: Not on file  . Sexually Abused: Not on file    Outpatient Medications Prior to Visit  Medication Sig Dispense Refill  . acetaminophen (TYLENOL) 500 MG tablet Take 1,000 mg by mouth every 6 (six) hours as needed for mild pain, moderate pain or headache.    . albuterol (PROVENTIL HFA;VENTOLIN HFA) 108 (90 Base) MCG/ACT inhaler Inhale 2 puffs into the lungs every 6 (six) hours as needed for wheezing or shortness of breath. 54 g 3  . ferrous sulfate 325 (65 FE) MG tablet Take 1 tablet (325 mg total)  by mouth daily with breakfast. 90 tablet 1  . fluticasone (FLONASE) 50 MCG/ACT nasal spray Place 1 spray into both nostrils daily. 16 g 6  . furosemide (LASIX) 20 MG tablet Take 1 tablet (20 mg total) by mouth daily. X 1 week 7 tablet 1  . hydrochlorothiazide (HYDRODIURIL) 25 MG tablet Take 1 tablet (25 mg total) by mouth daily. 30 tablet 2  . azithromycin (ZITHROMAX) 250 MG tablet Take 2 tablets (500 mg total) by mouth day one. Then 1 tablet (250 mg total) by mouth daily. (Patient not taking: Reported on 08/20/2020) 6 tablet 0  . ibuprofen (ADVIL,MOTRIN) 200 MG tablet Take 600 mg by mouth every 6 (six) hours as needed for headache, mild pain or moderate pain.  (Patient not taking: Reported on 08/20/2020)    . montelukast (SINGULAIR) 10 MG tablet Take 1  tablet (10 mg total) by mouth at bedtime. (Patient not taking: Reported on 10/14/2020) 30 tablet 6  . oxyCODONE-acetaminophen (PERCOCET/ROXICET) 5-325 MG tablet Take 1 tablet by mouth every 6 (six) hours as needed for severe pain. (Patient not taking: Reported on 08/20/2020) 5 tablet 0   Facility-Administered Medications Prior to Visit  Medication Dose Route Frequency Provider Last Rate Last Admin  . ipratropium-albuterol (DUONEB) 0.5-2.5 (3) MG/3ML nebulizer solution 3 mL  3 mL Nebulization Q20 Min PRN Hairston, Mandesia R, FNP        No Known Allergies  ROS Review of Systems  Constitutional: Positive for fatigue. Negative for chills and fever.  HENT: Negative for sore throat and trouble swallowing.   Cardiovascular: Positive for leg swelling (Lymphedema). Negative for chest pain and palpitations.  Gastrointestinal: Positive for constipation. Negative for abdominal pain, blood in stool, diarrhea and nausea.  Endocrine: Negative for polydipsia, polyphagia and polyuria.  Genitourinary: Negative for dysuria and frequency.  Musculoskeletal: Positive for gait problem. Negative for arthralgias.  Skin: Negative for rash and wound.  Hematological: Negative for adenopathy. Does not bruise/bleed easily.  Psychiatric/Behavioral: Negative for suicidal ideas. The patient is not nervous/anxious.       Objective:    Physical Exam Vitals and nursing note reviewed.  Constitutional:      General: She is not in acute distress.    Appearance: Normal appearance.  Cardiovascular:     Rate and Rhythm: Normal rate and regular rhythm.     Heart sounds: Murmur heard.   Pulmonary:     Effort: Pulmonary effort is normal.     Breath sounds: Normal breath sounds.  Abdominal:     Palpations: Abdomen is soft.     Tenderness: There is no abdominal tenderness. There is no right CVA tenderness, left CVA tenderness, guarding or rebound.  Musculoskeletal:        General: Swelling present.     Cervical back:  Normal range of motion and neck supple.     Right lower leg: Edema present.     Left lower leg: Edema present.     Comments: Patient with bilateral lower extremity edema however the left leg with larger diameter than right and some coarsening of the skin consistent with chronic edema  Lymphadenopathy:     Cervical: No cervical adenopathy.  Skin:    General: Skin is warm and dry.  Neurological:     General: No focal deficit present.     Mental Status: She is alert and oriented to person, place, and time.  Psychiatric:        Mood and Affect: Mood normal.  Behavior: Behavior normal.     Comments: Patient was slightly odd affect at today's visit and reported that she has been " celebrating" the birthdays of her 2 adult sons     BP (!) 143/89   Pulse (!) 114   Temp (!) 97.3 F (36.3 C)   Ht 5\' 9"  (1.753 m)   Wt 228 lb 3.2 oz (103.5 kg)   SpO2 99%   BMI 33.70 kg/m  Wt Readings from Last 3 Encounters:  10/14/20 228 lb 3.2 oz (103.5 kg)  08/20/20 228 lb (103.4 kg)  07/29/20 225 lb (102.1 kg)     Health Maintenance Due  Topic Date Due  . Hepatitis C Screening  Never done  . COVID-19 Vaccine (1) Never done  . PAP SMEAR-Modifier  Never done  . MAMMOGRAM  Never done  . COLONOSCOPY  Never done  . INFLUENZA VACCINE  Never done     Lab Results  Component Value Date   TSH 1.800 08/20/2020   Lab Results  Component Value Date   WBC 5.7 10/14/2020   HGB 11.5 10/14/2020   HCT 34.9 10/14/2020   MCV 93 10/14/2020   PLT 267 10/14/2020   Lab Results  Component Value Date   NA 141 10/14/2020   K 4.1 10/14/2020   CO2 23 10/14/2020   GLUCOSE 99 10/14/2020   BUN 15 10/14/2020   CREATININE 0.63 10/14/2020   BILITOT 0.3 08/20/2020   ALKPHOS 78 08/20/2020   AST 25 08/20/2020   ALT 16 08/20/2020   PROT 8.6 (H) 08/20/2020   ALBUMIN 4.0 08/20/2020   CALCIUM 9.4 10/14/2020   ANIONGAP 8 07/30/2020   Lab Results  Component Value Date   CHOL 178 04/24/2017   Lab Results   Component Value Date   HDL 75 04/24/2017   Lab Results  Component Value Date   LDLCALC 85 04/24/2017   Lab Results  Component Value Date   TRIG 92 04/24/2017   Lab Results  Component Value Date   CHOLHDL 2.4 04/24/2017   Lab Results  Component Value Date   HGBA1C 5.3 08/20/2020      Assessment & Plan:  1. Lymphedema of both lower extremities Patient with lymphedema of the lower extremities left greater than right.  She reports no prior vascular evaluation or treatment at a lymphedema clinic.  She reports the hydrochlorothiazide seems to slightly decrease the edema but edema is worse at the end of the workday.  Refill provided of hydrochlorothiazide as well as prescription for Lasix for acute worsening of edema.  Referral to vascular surgery for further evaluation and treatment - Ambulatory referral to Vascular Surgery - hydrochlorothiazide (HYDRODIURIL) 25 MG tablet; Take 1 tablet (25 mg total) by mouth daily.  Dispense: 90 tablet; Refill: 1 - furosemide (LASIX) 20 MG tablet; Take 1 tablet (20 mg total) by mouth daily. If needed for increased swelling  Dispense: 7 tablet; Refill: 1 - Basic Metabolic Panel  2. Iron deficiency anemia due to chronic blood loss; heart murmur Patient with iron deficiency anemia which she believes she was told was due to her heavy menses.  She has only recently not had her menses and is likely menopausal as she is also starting to get hot flashes.  Anemia should improve with cessation of menses.  She is currently on iron therapy once daily but complains of constipation related to iron therapy.  We will recheck CBC in follow-up of anemia.  Patient also with a murmur on exam which may be  related to her anemia but she may need further evaluation if her anemia has improved/near normal and her murmur persist. - CBC  3. Essential hypertension Patient with hypertension and blood pressure was very elevated at today's visit as she reports that she is out of  medication.  Prescription provided for hydrochlorothiazide and she is encouraged to have her blood pressure checked after restarting medication to make sure that her blood pressure decreases to the 140/90 or less range. - hydrochlorothiazide (HYDRODIURIL) 25 MG tablet; Take 1 tablet (25 mg total) by mouth daily.  Dispense: 90 tablet; Refill: 1  4. Other constipation Likely due to her current iron therapy and RX sent to pharmacy for colace - docusate sodium (COLACE) 100 MG capsule; Take 1 capsule (100 mg total) by mouth at bedtime. As needed for constipation  Dispense: 30 capsule; Refill: 3  5. Screening for colon cancer Patient is 61 and has not had prior screening for colon cancer there is sure he will be referred to gastroenterology to have this procedure. - Ambulatory referral to Gastroenterology   Meds ordered this encounter  Medications  . hydrochlorothiazide (HYDRODIURIL) 25 MG tablet    Sig: Take 1 tablet (25 mg total) by mouth daily.    Dispense:  90 tablet    Refill:  1  . furosemide (LASIX) 20 MG tablet    Sig: Take 1 tablet (20 mg total) by mouth daily. If needed for increased swelling    Dispense:  7 tablet    Refill:  1  . docusate sodium (COLACE) 100 MG capsule    Sig: Take 1 capsule (100 mg total) by mouth at bedtime. As needed for constipation    Dispense:  30 capsule    Refill:  3    Follow-up: Return in about 3 months (around 01/14/2021) for anemia/hypertension.    Cain Saupe, MD

## 2020-10-14 NOTE — Progress Notes (Signed)
Needs refills and iron pill making pt constipated needs stool softner

## 2020-10-15 LAB — BASIC METABOLIC PANEL WITH GFR
BUN/Creatinine Ratio: 24 — ABNORMAL HIGH (ref 9–23)
BUN: 15 mg/dL (ref 6–24)
CO2: 23 mmol/L (ref 20–29)
Calcium: 9.4 mg/dL (ref 8.7–10.2)
Chloride: 105 mmol/L (ref 96–106)
Creatinine, Ser: 0.63 mg/dL (ref 0.57–1.00)
GFR calc Af Amer: 118 mL/min/1.73
GFR calc non Af Amer: 103 mL/min/1.73
Glucose: 99 mg/dL (ref 65–99)
Potassium: 4.1 mmol/L (ref 3.5–5.2)
Sodium: 141 mmol/L (ref 134–144)

## 2020-10-15 LAB — CBC
Hematocrit: 34.9 % (ref 34.0–46.6)
Hemoglobin: 11.5 g/dL (ref 11.1–15.9)
MCH: 30.6 pg (ref 26.6–33.0)
MCHC: 33 g/dL (ref 31.5–35.7)
MCV: 93 fL (ref 79–97)
Platelets: 267 x10E3/uL (ref 150–450)
RBC: 3.76 x10E6/uL — ABNORMAL LOW (ref 3.77–5.28)
RDW: 15.4 % (ref 11.7–15.4)
WBC: 5.7 x10E3/uL (ref 3.4–10.8)

## 2020-12-15 ENCOUNTER — Other Ambulatory Visit: Payer: Self-pay

## 2020-12-15 DIAGNOSIS — I890 Lymphedema, not elsewhere classified: Secondary | ICD-10-CM

## 2020-12-31 ENCOUNTER — Ambulatory Visit (HOSPITAL_COMMUNITY)
Admission: RE | Admit: 2020-12-31 | Discharge: 2020-12-31 | Disposition: A | Discharge status: Home / Self Care | Payer: Managed Care, Other (non HMO) | Source: Ambulatory Visit | Attending: Physician Assistant | Admitting: Physician Assistant

## 2020-12-31 ENCOUNTER — Ambulatory Visit (INDEPENDENT_AMBULATORY_CARE_PROVIDER_SITE_OTHER): Payer: Managed Care, Other (non HMO) | Admitting: Physician Assistant

## 2020-12-31 ENCOUNTER — Other Ambulatory Visit: Payer: Self-pay

## 2020-12-31 VITALS — BP 170/106 | HR 82 | Temp 98.2°F | Ht 69.0 in | Wt 228.3 lb

## 2020-12-31 DIAGNOSIS — G8929 Other chronic pain: Secondary | ICD-10-CM | POA: Diagnosis not present

## 2020-12-31 DIAGNOSIS — M79605 Pain in left leg: Secondary | ICD-10-CM

## 2020-12-31 DIAGNOSIS — I890 Lymphedema, not elsewhere classified: Secondary | ICD-10-CM | POA: Diagnosis not present

## 2020-12-31 DIAGNOSIS — R2242 Localized swelling, mass and lump, left lower limb: Secondary | ICD-10-CM

## 2020-12-31 NOTE — Progress Notes (Signed)
Requested by:  Cain Saupe, MD No address on file  Reason for consultation: left anke swelling    History of Present Illness   Brittany Beasley is a 55 y.o. (Jan 03, 1966) female who presents for evaluation of left ankle and lower leg swelling over the past two years with worsening appearance.  She denies claudication, rest pain, previous vein procedures, previous pelvis surgery or radiation, no previous DVT.  Venous symptoms include: positive if (X) [  ] aching [  ] heavy [  ] tired  [  ] throbbing [ x ] burning  [  ] itching [ x ]swelling [  ] bleeding [  ] ulcer Onset/duration:  2 years  Occupation:  [employer] Aggravating factors: standing Alleviating factors: none Compression:  Yes  Helps:  no Pain medications:  none Previous vein procedures:  non History of DVT:  no  Past Medical History:  Diagnosis Date  . Asthma   . Lymphedema 08/17/2016   LLE following cellulitis 06/2016    Past Surgical History:  Procedure Laterality Date  . TUBAL LIGATION      Social History   Socioeconomic History  . Marital status: Single    Spouse name: Not on file  . Number of children: Not on file  . Years of education: Not on file  . Highest education level: Not on file  Occupational History  . Not on file  Tobacco Use  . Smoking status: Former Games developer  . Smokeless tobacco: Never Used  Substance and Sexual Activity  . Alcohol use: Yes    Comment: ocassionally  . Drug use: No  . Sexual activity: Yes    Birth control/protection: Surgical  Other Topics Concern  . Not on file  Social History Narrative  . Not on file   Social Determinants of Health   Financial Resource Strain: Not on file  Food Insecurity: Not on file  Transportation Needs: Not on file  Physical Activity: Not on file  Stress: Not on file  Social Connections: Not on file  Intimate Partner Violence: Not on file   No family history on file.  Current Outpatient Medications   Medication Sig Dispense Refill  . acetaminophen (TYLENOL) 500 MG tablet Take 1,000 mg by mouth every 6 (six) hours as needed for mild pain, moderate pain or headache.    . ferrous sulfate 325 (65 FE) MG tablet Take 1 tablet (325 mg total) by mouth daily with breakfast. 90 tablet 1  . furosemide (LASIX) 20 MG tablet Take 1 tablet (20 mg total) by mouth daily. If needed for increased swelling 7 tablet 1  . hydrochlorothiazide (HYDRODIURIL) 25 MG tablet Take 1 tablet (25 mg total) by mouth daily. 90 tablet 1  . ibuprofen (ADVIL,MOTRIN) 200 MG tablet Take 600 mg by mouth every 6 (six) hours as needed for headache, mild pain or moderate pain.    Marland Kitchen albuterol (PROVENTIL HFA;VENTOLIN HFA) 108 (90 Base) MCG/ACT inhaler Inhale 2 puffs into the lungs every 6 (six) hours as needed for wheezing or shortness of breath. (Patient not taking: Reported on 12/31/2020) 54 g 3  . azithromycin (ZITHROMAX) 250 MG tablet Take 2 tablets (500 mg total) by mouth day one. Then 1 tablet (250 mg total) by mouth daily. (Patient not taking: No sig reported) 6 tablet 0  . docusate sodium (COLACE) 100 MG capsule Take 1 capsule (100 mg total) by mouth at bedtime. As needed for constipation (Patient not taking: Reported on 12/31/2020) 30 capsule  3  . fluticasone (FLONASE) 50 MCG/ACT nasal spray Place 1 spray into both nostrils daily. (Patient not taking: Reported on 12/31/2020) 16 g 6  . montelukast (SINGULAIR) 10 MG tablet Take 1 tablet (10 mg total) by mouth at bedtime. (Patient not taking: Reported on 12/31/2020) 30 tablet 6   Current Facility-Administered Medications  Medication Dose Route Frequency Provider Last Rate Last Admin  . ipratropium-albuterol (DUONEB) 0.5-2.5 (3) MG/3ML nebulizer solution 3 mL  3 mL Nebulization Q20 Min PRN Hairston, Mandesia R, FNP        No Known Allergies  REVIEW OF SYSTEMS (negative unless checked):   Cardiac:  []  Chest pain or chest pressure? []  Shortness of breath upon activity? []  Shortness  of breath when lying flat? []  Irregular heart rhythm?  Vascular:  []  Pain in calf, thigh, or hip brought on by walking? []  Pain in feet at night that wakes you up from your sleep? []  Blood clot in your veins? [x]  Leg swelling?  Pulmonary:  []  Oxygen at home? []  Productive cough? []  Wheezing?  Neurologic:  []  Sudden weakness in arms or legs? []  Sudden numbness in arms or legs? []  Sudden onset of difficult speaking or slurred speech? []  Temporary loss of vision in one eye? []  Problems with dizziness?  Gastrointestinal:  []  Blood in stool? []  Vomited blood?  Genitourinary:  []  Burning when urinating? []  Blood in urine?  Psychiatric:  []  Major depression  Hematologic:  []  Bleeding problems? []  Problems with blood clotting?  Dermatologic:  []  Rashes or ulcers?  Constitutional:  []  Fever or chills?  Ear/Nose/Throat:  []  Change in hearing? []  Nose bleeds? []  Sore throat?  Musculoskeletal:  []  Back pain? []  Joint pain? []  Muscle pain?   Physical Examination     Vitals:   12/31/20 1502  BP: (!) 170/106  Pulse: 82  Temp: 98.2 F (36.8 C)  TempSrc: Temporal  SpO2: 97%  Weight: 228 lb 4.8 oz (103.6 kg)  Height: 5\' 9"  (1.753 m)   Body mass index is 33.71 kg/m.  General:  WDWN in NAD; vital signs documented above Gait: unaided, no ataxia HENT: WNL, normocephalic Pulmonary: normal non-labored breathing  Cardiac: regular HR and rhythm Skin: without rashes Extremities: without varicose veins, without reticular veins, with edema confined to left ankle and distal lower leg, with stasis pigmentation, without lipodermatosclerosis, without ulcers Musculoskeletal: no muscle wasting or atrophy  Neurologic: A&O X 3;  No focal weakness or paresthesias are detected Psychiatric:  The pt has Normal affect.        Non-invasive Vascular Imaging   BLE Venous Insufficiency Duplex (12/31/2020):  Bilateral:  - No evidence of deep vein thrombosis seen in the  lower extremities,  bilaterally, from the common femoral through the popliteal veins.  - No evidence of superficial venous thrombosis in the lower extremities,  bilaterally.  - No evidence of deep venous insufficiency seen bilaterally in the lower  extremity.  - No evidence of superficial venous reflux seen in the greater saphenous  veins bilaterally.  - No evidence of superficial venous reflux seen in the short saphenous  veins bilaterally.     Medical Decision Making    Brittany Beasley is a 55 y.o. female who presents with: LLE chronic lympedema. This edema is non-pitting and confined to left ankle and distal lower leg. No evidence of venous reflux or DVT.  No evidence of arterial insufficiency.    Based on the patient's history and examination, I recommend: lymphedema pump.  I  discussed with the patient the use of her 15-20 mm Hg compression stockings and elevation.  Written information for vein health and lymphedema pump pamphlet provided.  The patient will follow up in 3 months with Dr. Darrick Penna or Edilia Bo.  Thank you for allowing [place] to participate in this patient's care.   Milinda Antis, PA-C [facility] Office: [phone]  12/31/2020, 3:51 PM  Clinic MD: Edilia Bo

## 2021-01-18 ENCOUNTER — Other Ambulatory Visit: Payer: Self-pay | Admitting: Pharmacist

## 2021-01-18 DIAGNOSIS — I890 Lymphedema, not elsewhere classified: Secondary | ICD-10-CM

## 2021-01-18 MED ORDER — FUROSEMIDE 20 MG PO TABS: 20.0000 mg | ORAL_TABLET | Freq: Every day | ORAL | 0 refills | Status: DC

## 2021-01-24 ENCOUNTER — Other Ambulatory Visit: Payer: Self-pay | Admitting: Family Medicine

## 2021-01-24 DIAGNOSIS — I890 Lymphedema, not elsewhere classified: Secondary | ICD-10-CM

## 2021-04-08 ENCOUNTER — Ambulatory Visit: Payer: Managed Care, Other (non HMO) | Admitting: Vascular Surgery

## 2021-06-17 ENCOUNTER — Ambulatory Visit: Payer: Managed Care, Other (non HMO) | Admitting: Vascular Surgery

## 2021-09-23 ENCOUNTER — Ambulatory Visit: Payer: Managed Care, Other (non HMO) | Admitting: Vascular Surgery

## 2023-06-27 ENCOUNTER — Other Ambulatory Visit: Payer: Self-pay

## 2023-06-27 ENCOUNTER — Encounter (HOSPITAL_COMMUNITY): Payer: Self-pay | Admitting: Emergency Medicine

## 2023-06-27 ENCOUNTER — Emergency Department (HOSPITAL_COMMUNITY): Payer: Managed Care, Other (non HMO)

## 2023-06-27 ENCOUNTER — Emergency Department (HOSPITAL_COMMUNITY)
Admission: EM | Admit: 2023-06-27 | Discharge: 2023-06-27 | Disposition: A | Discharge status: Home / Self Care | Payer: Managed Care, Other (non HMO) | Attending: Emergency Medicine | Admitting: Emergency Medicine

## 2023-06-27 ENCOUNTER — Emergency Department (HOSPITAL_BASED_OUTPATIENT_CLINIC_OR_DEPARTMENT_OTHER): Payer: Managed Care, Other (non HMO)

## 2023-06-27 DIAGNOSIS — I10 Essential (primary) hypertension: Secondary | ICD-10-CM | POA: Diagnosis not present

## 2023-06-27 DIAGNOSIS — Z79899 Other long term (current) drug therapy: Secondary | ICD-10-CM | POA: Insufficient documentation

## 2023-06-27 DIAGNOSIS — M7989 Other specified soft tissue disorders: Secondary | ICD-10-CM | POA: Diagnosis not present

## 2023-06-27 DIAGNOSIS — R600 Localized edema: Secondary | ICD-10-CM | POA: Insufficient documentation

## 2023-06-27 LAB — BASIC METABOLIC PANEL
Anion gap: 9 (ref 5–15)
BUN: 9 mg/dL (ref 6–20)
CO2: 25 mmol/L (ref 22–32)
Calcium: 8.9 mg/dL (ref 8.9–10.3)
Chloride: 103 mmol/L (ref 98–111)
Creatinine, Ser: 0.55 mg/dL (ref 0.44–1.00)
GFR, Estimated: >60 mL/min (ref >60)
Glucose, Bld: 111 mg/dL — ABNORMAL HIGH (ref 70–99)
Potassium: 3.4 mmol/L — ABNORMAL LOW (ref 3.5–5.1)
Sodium: 137 mmol/L (ref 135–145)

## 2023-06-27 LAB — CBC WITH DIFFERENTIAL/PLATELET
Abs Immature Granulocytes: 0.05 10*3/uL (ref 0.00–0.07)
Basophils Absolute: 0 10*3/uL (ref 0.0–0.1)
Basophils Relative: 0 %
Eosinophils Absolute: 0.1 10*3/uL (ref 0.0–0.5)
Eosinophils Relative: 1 %
HCT: 33.8 % — ABNORMAL LOW (ref 36.0–46.0)
Hemoglobin: 11 g/dL — ABNORMAL LOW (ref 12.0–15.0)
Immature Granulocytes: 1 %
Lymphocytes Relative: 23 %
Lymphs Abs: 1.9 10*3/uL (ref 0.7–4.0)
MCH: 31.7 pg (ref 26.0–34.0)
MCHC: 32.5 g/dL (ref 30.0–36.0)
MCV: 97.4 fL (ref 80.0–100.0)
Monocytes Absolute: 1 10*3/uL (ref 0.1–1.0)
Monocytes Relative: 12 %
Neutro Abs: 5.3 10*3/uL (ref 1.7–7.7)
Neutrophils Relative %: 63 %
Platelets: 246 10*3/uL (ref 150–400)
RBC: 3.47 MIL/uL — ABNORMAL LOW (ref 3.87–5.11)
RDW: 13.9 % (ref 11.5–15.5)
WBC: 8.4 10*3/uL (ref 4.0–10.5)
nRBC: 0 % (ref 0.0–0.2)

## 2023-06-27 MED ORDER — HYDROCHLOROTHIAZIDE 25 MG PO TABS: 25.0000 mg | ORAL_TABLET | Freq: Every day | ORAL | 0 refills | Status: DC

## 2023-06-27 MED ORDER — FUROSEMIDE 20 MG PO TABS: 40.0000 mg | ORAL_TABLET | Freq: Every day | ORAL | 0 refills | Status: AC

## 2023-06-27 NOTE — ED Triage Notes (Signed)
Patient arrives ambulatory by POV c/o left leg swelling flare up x 3 days. Tried to wear compression socks but became too tight. Patient states she has a flare up once a year. In the past has been given a diuretic and that has helped, took OTC diuretic but no change. Has appt with [facility] on 16th. Has tried motrin and ice wrap but no relief.

## 2023-06-27 NOTE — ED Provider Notes (Signed)
[place] EMERGENCY DEPARTMENT AT [facility] Provider Note   CSN: [id number] Arrival date & time: 06/27/23  4098     History Chief Complaint  Patient presents with   Leg Swelling    Brittany Beasley is a 57 y.o. female. Patient presents to the ED with concerns of left leg swelling.  Patient reports that she will periodically get swelling in the left leg approximately 1 time per year.  No prior history of CHF as far she is aware.  Was previously on hydrochlorothiazide and Lasix but is not currently on this medication in place.  She reports she stopped taking HCTZ out of "concern" but would not elaborate what her concern was.  Currently denies any chest pain, shortness of breath, Donnell pain, nausea, vomiting.  HPI     Home Medications Prior to Admission medications   Medication Sig Start Date End Date Taking? Authorizing Provider  furosemide (LASIX) 20 MG tablet Take 2 tablets (40 mg total) by mouth daily for 10 days. 06/27/23 07/07/23 Yes Smitty Knudsen, PA-C  hydrochlorothiazide (HYDRODIURIL) 25 MG tablet Take 1 tablet (25 mg total) by mouth daily. 06/27/23  Yes Smitty Knudsen, PA-C  acetaminophen (TYLENOL) 500 MG tablet Take 1,000 mg by mouth every 6 (six) hours as needed for mild pain, moderate pain or headache.    [provider]  albuterol (PROVENTIL HFA;VENTOLIN HFA) 108 (90 Base) MCG/ACT inhaler Inhale 2 puffs into the lungs every 6 (six) hours as needed for wheezing or shortness of breath. Patient not taking: Reported on 12/31/2020 06/26/17   Quentin Angst, MD  azithromycin (ZITHROMAX) 250 MG tablet Take 2 tablets (500 mg total) by mouth day one. Then 1 tablet (250 mg total) by mouth daily. Patient not taking: No sig reported 04/24/17   Lizbeth Bark, FNP  docusate sodium (COLACE) 100 MG capsule Take 1 capsule (100 mg total) by mouth at bedtime. As needed for constipation Patient not taking: Reported on 12/31/2020 10/14/20   Cain Saupe, MD   ferrous sulfate 325 (65 FE) MG tablet Take 1 tablet (325 mg total) by mouth daily with breakfast. 08/20/20   Anders Simmonds, PA-C  fluticasone (FLONASE) 50 MCG/ACT nasal spray Place 1 spray into both nostrils daily. Patient not taking: Reported on 12/31/2020 04/24/17   Lizbeth Bark, FNP  furosemide (LASIX) 20 MG tablet Take 1 tablet (20 mg total) by mouth daily. If needed for increased swelling. Please make PCP appointment. 01/18/21   Hoy Register, MD  hydrochlorothiazide (HYDRODIURIL) 25 MG tablet Take 1 tablet (25 mg total) by mouth daily. 10/14/20   Fulp, Cammie, MD  ibuprofen (ADVIL,MOTRIN) 200 MG tablet Take 600 mg by mouth every 6 (six) hours as needed for headache, mild pain or moderate pain.    [provider]  montelukast (SINGULAIR) 10 MG tablet Take 1 tablet (10 mg total) by mouth at bedtime. Patient not taking: Reported on 12/31/2020 04/24/17   Lizbeth Bark, FNP      Allergies    Patient has no known allergies.    Review of Systems   Review of Systems  Cardiovascular:  Positive for leg swelling.  All other systems reviewed and are negative.   Physical Exam Updated Vital Signs BP (!) 170/88 (BP Location: Left Arm)   Pulse 78   Temp 98.4 F (36.9 C) (Oral)   Resp 18   Ht 5\' 9"  (1.753 m)   Wt 103.4 kg   SpO2 100%   BMI 33.67  kg/m  Physical Exam Vitals and nursing note reviewed.  Constitutional:      General: She is not in acute distress.    Appearance: She is well-developed.  HENT:     Head: Normocephalic and atraumatic.  Eyes:     Conjunctiva/sclera: Conjunctivae normal.  Cardiovascular:     Rate and Rhythm: Normal rate and regular rhythm.     Heart sounds: No murmur heard. Pulmonary:     Effort: Pulmonary effort is normal. No respiratory distress.     Breath sounds: Normal breath sounds.  Abdominal:     Palpations: Abdomen is soft.     Tenderness: There is no abdominal tenderness.  Musculoskeletal:        General: No swelling.      Cervical back: Neck supple.     Right lower leg: No edema.     Left lower leg: Swelling present. 3+ Pitting Edema present.     Comments: Pitting edema noted to the left lower extremity.  Extent of edema from the left knee to the left ankle.  Right leg unremarkable.  Skin:    General: Skin is warm and dry.     Capillary Refill: Capillary refill takes less than 2 seconds.  Neurological:     Mental Status: She is alert.  Psychiatric:        Mood and Affect: Mood normal.     ED Results / Procedures / Treatments   Labs (all labs ordered are listed, but only abnormal results are displayed) Labs Reviewed  CBC WITH DIFFERENTIAL/PLATELET - Abnormal; Notable for the following components:      Result Value   RBC 3.47 (*)    Hemoglobin 11.0 (*)    HCT 33.8 (*)    All other components within normal limits  BASIC METABOLIC PANEL - Abnormal; Notable for the following components:   Potassium 3.4 (*)    Glucose, Bld 111 (*)    All other components within normal limits    EKG None  Radiology No results found.  Procedures Procedures   Medications Ordered in ED Medications - No data to display  ED Course/ Medical Decision Making/ A&P                           Medical Decision Making Amount and/or Complexity of Data Reviewed Labs: ordered. Radiology: ordered.  Risk Prescription drug management.   This patient presents to the ED for concern of leg swelling.  Differential diagnosis includes: edema, DVT, cellulitis, injury, joint effusion   Lab Tests:  I Ordered, and personally interpreted labs.  The pertinent results include: CBC with mild anemia, BMP with mild hypokalemia but no other abnormalities   Imaging Studies ordered:  I ordered imaging studies including ultrasound left lower extremity, chest x-ray I independently visualized and interpreted imaging which showed no evidence of DVT, some cardiomegaly noted on x-ray but no evidence of pulmonary congestion or edema I  agree with the radiologist interpretation   Problem List / ED Course:  Patient presents to the emergency department complaints of left lower leg extremity swelling.  Previously on hydrochlorothiazide and Lasix for lows but is been off these medications for some time now.  Denying chest pain or shortness of breath.  In the past when she has leg swelling develop like this, she begins taking Lasix and this typically is able to resolve her swelling.  Throat and foot color was intermittent recurrence of the leg swelling so infrequently.  Will evaluate with some basic labs as well as ultrasound of the left lower extremity.  Chest x-ray added on as well for evaluation of possible pulmonary congestion. Labs are unremarkable. Ultrasound left lower extremity does not show any signs of a DVT.  Chest x-ray does show mild cardiomegaly but no evidence of pulmonary edema or pulmonary congestion. Patient does appear to have notable edema in this leg that appears to be due to chronic problems of fluid retention. Low concern for infection as there is no erythema or warmth to the area. Will renew patient's hydrochlorothiazide as well as lasix for management of swelling and HTN. Patient is agreeable with treatment plan and verbalized understanding all return precautions. Encouraged patient to follow up with PCP for further management of symptoms. All questions answered prior to patient discharged. Patient discharged home in good condition.  Final Clinical Impression(s) / ED Diagnoses Final diagnoses:  Leg edema, left  Hypertension, unspecified type    Rx / DC Orders ED Discharge Orders          Ordered    hydrochlorothiazide (HYDRODIURIL) 25 MG tablet  Daily        06/27/23 1244    furosemide (LASIX) 20 MG tablet  Daily        06/27/23 1244              Salomon Mast 06/30/23 2151    Benjiman Core, MD 07/03/23 1455

## 2023-06-27 NOTE — Discharge Instructions (Addendum)
You were seen in the emergency department for leg swelling.  Your labs were largely reassuring and your imaging was negative for any acute abnormalities.  There is no evidence of any blood clot in your leg.  You likely are experiencing significant swelling but not sure why this is happening.  I would recommend restarting blood pressure medicine the hydrochlorothiazide.  I have sent a refill this medication to your pharmacy.  I have also sent in a prescription for Lasix that he should take for the next week or so to get your symptoms under control of the blood pressure medication kicks in.  Please follow-up with your primary care provider that you are currently setting up to see.  If you have any acute worsening of your symptoms please return the emergency department.

## 2023-06-27 NOTE — ED Notes (Signed)
Pt verbalized understanding of discharge instructions. Pt ambulated from ed with steady gait.  

## 2023-06-27 NOTE — Progress Notes (Signed)
Left lower extremity venous study completed.   Preliminary results relayed to PA in ER.  Please see CV Procedures for preliminary results.  Deisy Ozbun, RVT  11:44 AM 06/27/23

## 2024-04-01 ENCOUNTER — Ambulatory Visit: Payer: Self-pay | Admitting: Nurse Practitioner

## 2024-04-01 ENCOUNTER — Encounter: Payer: Self-pay | Admitting: Nurse Practitioner

## 2024-04-01 VITALS — BP 140/84 | HR 88 | Temp 98.0°F | Ht 69.0 in | Wt 209.0 lb

## 2024-04-01 DIAGNOSIS — Z1231 Encounter for screening mammogram for malignant neoplasm of breast: Secondary | ICD-10-CM | POA: Insufficient documentation

## 2024-04-01 DIAGNOSIS — N898 Other specified noninflammatory disorders of vagina: Secondary | ICD-10-CM

## 2024-04-01 DIAGNOSIS — M7989 Other specified soft tissue disorders: Secondary | ICD-10-CM

## 2024-04-01 DIAGNOSIS — R062 Wheezing: Secondary | ICD-10-CM

## 2024-04-01 DIAGNOSIS — Z1211 Encounter for screening for malignant neoplasm of colon: Secondary | ICD-10-CM

## 2024-04-01 DIAGNOSIS — E6609 Other obesity due to excess calories: Secondary | ICD-10-CM

## 2024-04-01 DIAGNOSIS — Z2821 Immunization not carried out because of patient refusal: Secondary | ICD-10-CM

## 2024-04-01 DIAGNOSIS — I10 Essential (primary) hypertension: Secondary | ICD-10-CM | POA: Diagnosis not present

## 2024-04-01 DIAGNOSIS — R9431 Abnormal electrocardiogram [ECG] [EKG]: Secondary | ICD-10-CM

## 2024-04-01 DIAGNOSIS — Z1159 Encounter for screening for other viral diseases: Secondary | ICD-10-CM

## 2024-04-01 DIAGNOSIS — Z113 Encounter for screening for infections with a predominantly sexual mode of transmission: Secondary | ICD-10-CM

## 2024-04-01 DIAGNOSIS — Z6830 Body mass index (BMI) 30.0-30.9, adult: Secondary | ICD-10-CM

## 2024-04-01 DIAGNOSIS — Z124 Encounter for screening for malignant neoplasm of cervix: Secondary | ICD-10-CM

## 2024-04-01 DIAGNOSIS — Z7689 Persons encountering health services in other specified circumstances: Secondary | ICD-10-CM

## 2024-04-01 DIAGNOSIS — E66811 Obesity, class 1: Secondary | ICD-10-CM

## 2024-04-01 DIAGNOSIS — Z79899 Other long term (current) drug therapy: Secondary | ICD-10-CM

## 2024-04-01 HISTORY — DX: Other specified noninflammatory disorders of vagina: N89.8

## 2024-04-01 HISTORY — DX: Immunization not carried out because of patient refusal: Z28.21

## 2024-04-01 HISTORY — DX: Encounter for screening for malignant neoplasm of colon: Z12.11

## 2024-04-01 HISTORY — DX: Encounter for screening mammogram for malignant neoplasm of breast: Z12.31

## 2024-04-01 HISTORY — DX: Other specified soft tissue disorders: M79.89

## 2024-04-01 HISTORY — DX: Persons encountering health services in other specified circumstances: Z76.89

## 2024-04-01 LAB — POCT URINALYSIS DIP (CLINITEK)
Bilirubin, UA: NEGATIVE
Blood, UA: NEGATIVE
Glucose, UA: NEGATIVE mg/dL
Leukocytes, UA: NEGATIVE
Nitrite, UA: NEGATIVE
POC PROTEIN,UA: 100 — AB
Spec Grav, UA: >=1.03 — AB (ref 1.010–1.025)
Urobilinogen, UA: 1 U/dL
pH, UA: 6 (ref 5.0–8.0)

## 2024-04-01 MED ORDER — AIRSUPRA 90-80 MCG/ACT IN AERO: 2.0000 | INHALATION_SPRAY | Freq: Four times a day (QID) | RESPIRATORY_TRACT | 1 refills | Status: AC | PRN

## 2024-04-01 MED ORDER — TRIAMCINOLONE ACETONIDE 40 MG/ML IJ SUSP
60.0000 mg | Freq: Once | INTRAMUSCULAR | Status: AC
  Administered 2024-04-01: 60 mg via INTRAMUSCULAR

## 2024-04-01 MED ORDER — TRIAMTERENE-HCTZ 37.5-25 MG PO TABS: ORAL_TABLET | ORAL | 1 refills | Status: AC

## 2024-04-01 MED ORDER — MONTELUKAST SODIUM 10 MG PO TABS: 10.0000 mg | ORAL_TABLET | Freq: Every day | ORAL | 2 refills | Status: AC

## 2024-04-01 NOTE — Progress Notes (Signed)
 Brittany Beasley, CMA,acting as a Neurosurgeon for Brittany Epley, FNP.,have documented all relevant documentation on the behalf of Brittany Epley, FNP,as directed by  Brittany Epley, FNP while in the presence of Brittany Epley, FNP.  Subjective:  Patient ID: Brittany Beasley , female    DOB: 05-24-1966 , 58 y.o.   MRN: [id number]  Chief Complaint  Patient presents with   Establish Care    HPI  Patient presents today to establish care. She would go to [facility] for her urgent care needs. She works as a Lawyer. Single. She has 3 children - Brittany Beasley [address] - he has Myasthenia Gravis, Brittany Beasley 55 - HTN, and Brittany Beasley 7.   PMH - couple years had asthma - will get short of breath, never seen a pulmonologist. She self diagnosed herself. She would go to the ER and get treated. She smoked cigarettes for 10-15 years. Years ago her legs would swell and she was given a fluid pill. She thinks at that time she was working too much and wear compression socks and cut back on sodium. She had been on a fluid pill in the past after review she has had several elevated blood pressures.   Patient reports compliance with medication. Patient denies any chest pain, SOB, or headaches. Patient reports she thinks she is having a asthma flare. Patient reports her bones ache for the past 6 months. She states sometimes she can't move her hands. 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      Past Medical History:  Diagnosis Date   Asthma    Lymphedema 08/17/2016   LLE following cellulitis 06/2016     Family History  Problem Relation Age of Onset   Hypertension Mother    Alcohol abuse Father      Current Outpatient Medications:    Albuterol -Budesonide (AIRSUPRA ) 90-80 MCG/ACT AERO, Inhale 2 puffs into the lungs every 6 (six) hours as needed., Disp: 32.1 g, Rfl: 1   montelukast  (SINGULAIR ) 10 MG tablet, Take 1 tablet (10 mg total) by mouth daily., Disp: 30 tablet, Rfl: 2    triamterene -hydrochlorothiazide  (MAXZIDE-25) 37.5-25 MG tablet, Take 1/2 tablet by mouth daily in am, Disp: 45 tablet, Rfl: 1   acetaminophen  (TYLENOL ) 500 MG tablet, Take 1,000 mg by mouth every 6 (six) hours as needed for mild pain, moderate pain or headache. (Patient not taking: Reported on 04/01/2024), Disp: , Rfl:    docusate sodium  (COLACE) 100 MG capsule, Take 1 capsule (100 mg total) by mouth at bedtime. As needed for constipation (Patient not taking: Reported on 04/01/2024), Disp: 30 capsule, Rfl: 3   ferrous sulfate  325 (65 FE) MG tablet, Take 1 tablet (325 mg total) by mouth daily with breakfast. (Patient not taking: Reported on 04/01/2024), Disp: 90 tablet, Rfl: 1   fluticasone  (FLONASE ) 50 MCG/ACT nasal spray, Place 1 spray into both nostrils daily. (Patient not taking: Reported on 04/01/2024), Disp: 16 g, Rfl: 6   furosemide  (LASIX ) 20 MG tablet, Take 2 tablets (40 mg total) by mouth daily for 10 days., Disp: 10 tablet, Rfl: 0   ibuprofen  (ADVIL ,MOTRIN ) 200 MG tablet, Take 600 mg by mouth every 6 (six) hours as needed for headache, mild pain or moderate pain. (Patient not taking: Reported on 04/01/2024), Disp: , Rfl:    No Known Allergies   Review of Systems  Constitutional: Negative.   Respiratory:  Positive for shortness of breath and wheezing. Negative for cough.   Cardiovascular: Negative.  Neurological: Negative.   Psychiatric/Behavioral: Negative.       Today's Vitals   04/01/24 1517 04/01/24 1626  BP: (!) 140/80 (!) 140/84  Pulse: 88   Temp: 98 F (36.7 C)   TempSrc: Oral   Weight: 209 lb (94.8 kg)   Height: 5\' 9"  (1.753 m)   PainSc: 0-No pain    Body mass index is 30.86 kg/m.  Wt Readings from Last 3 Encounters:  04/01/24 209 lb (94.8 kg)  06/27/23 228 lb (103.4 kg)  12/31/20 228 lb 4.8 oz (103.6 kg)    Objective:  Physical Exam Vitals reviewed.  Constitutional:      General: She is not in acute distress.    Appearance: Normal appearance.  Cardiovascular:      Rate and Rhythm: Normal rate and regular rhythm.     Pulses: Normal pulses.     Heart sounds: Normal heart sounds. No murmur heard. Pulmonary:     Effort: Pulmonary effort is normal. No respiratory distress.     Breath sounds: Examination of the right-upper field reveals wheezing. Examination of the left-upper field reveals wheezing. Examination of the right-middle field reveals wheezing. Examination of the left-middle field reveals wheezing. Examination of the right-lower field reveals wheezing. Examination of the left-lower field reveals wheezing. Wheezing present.  Skin:    General: Skin is warm and dry.     Capillary Refill: Capillary refill takes less than 2 seconds.  Neurological:     General: No focal deficit present.     Mental Status: She is alert and oriented to person, place, and time.     Cranial Nerves: No cranial nerve deficit.     Motor: No weakness.         Assessment And Plan:  Establishing care with new doctor, encounter for Assessment & Plan: Patient is here to establish care. Went over patient medical, family, social and surgical history. Reviewed with patient their medications and any allergies  Reviewed with patient their sexual orientation, drug/tobacco and alcohol use Dicussed any new concerns with patient  recommended patient comes in for a physical exam and complete blood work.  Educated patient about the importance of annual screenings and immunizations.  Advised patient to eat a healthy diet along with exercise for atleast 30-45 min atleast 4-5 days of the week.     Essential hypertension Assessment & Plan: Blood pressure is elevated, will start her on triamterene /hydrochlorothiazide  1/2 tab daily. Return to office in 6 weeks.   Orders: -     POCT URINALYSIS DIP (CLINITEK) -     Brain natriuretic peptide -     CMP14+EGFR -     Triamterene -HCTZ; Take 1/2 tablet by mouth daily in am  Dispense: 45 tablet; Refill: 1 -     EKG 12-Lead -      Ambulatory referral to Cardiology  Wheezing Assessment & Plan: Will treat with steroid injection and start her on singulair . She reports she has asthma but not officially diagnosed. Will start her on airsupra  as needed  Orders: -     Triamcinolone  Acetonide -     Montelukast  Sodium; Take 1 tablet (10 mg total) by mouth daily.  Dispense: 30 tablet; Refill: 2  Vaginal irritation Assessment & Plan: Will check vaginal swab  Orders: -     CBC with Differential/Platelet -     Hemoglobin A1c  Leg swelling Assessment & Plan: She has 1+ edema to bilateral lower legs. She is encouraged to keep them elevated and wear support socks  during the day. Will check BNP.   Orders: -     POCT URINALYSIS DIP (CLINITEK)  Abnormal EKG Assessment & Plan: First degree AV block and left atrial enlargement these are new and will refer to Cardiology  Orders: -     Ambulatory referral to Cardiology  COVID-19 vaccination declined Assessment & Plan: Declines covid 19 vaccine. Discussed risk of covid 4 and if she changes her mind about the vaccine to call the office. Education has been provided regarding the importance of this vaccine but patient still declined. Advised may receive this vaccine at local pharmacy or Health Dept.or vaccine clinic. Aware to provide a copy of the vaccination record if obtained from local pharmacy or Health Dept.  Encouraged to take multivitamin, vitamin d, vitamin c and zinc to increase immune system. Aware can call office if would like to have vaccine here at office. Verbalized acceptance and understanding.    Herpes zoster vaccination declined Assessment & Plan: Declines shingrix, educated on disease process and is aware if he changes his mind to notify office    Screening for colon cancer Assessment & Plan: According to USPTF Colorectal cancer Screening guidelines. Colonoscopy is recommended every 10 years, starting at age 57 years. Will refer to GI for colon cancer  screening.   Orders: -     Ambulatory referral to Gastroenterology  Encounter for Papanicolaou smear of cervix -     Ambulatory referral to Obstetrics / Gynecology  Encounter for screening mammogram for malignant neoplasm of breast Assessment & Plan: Pt instructed on Self Breast Exam.According to ACOG guidelines Women aged 10 and older are recommended to get an annual mammogram.    Orders: -     Ambulatory referral to Obstetrics / Gynecology -     Digital Screening Mammogram, Left and Right; Future  Class 1 obesity due to excess calories without serious comorbidity with body mass index (BMI) of 30.0 to 30.9 in adult Assessment & Plan: She is encouraged to strive for BMI less than 30 to decrease cardiac risk. Advised to aim for at least 150 minutes of exercise per week.    Screening for STDs (sexually transmitted diseases) -     NuSwab Vaginitis Plus (VG+) -     RPR -     HSV 1 and 2 Ab, IgG -     HIV Antibody (routine testing w rflx)  Encounter for hepatitis C screening test for low risk patient -     Hepatitis C antibody  Other long term (current) drug therapy  Other orders -     Airsupra ; Inhale 2 puffs into the lungs every 6 (six) hours as needed.  Dispense: 32.1 g; Refill: 1    Return in about 4 months (around 08/01/2024) for phy when able; 6 week f/u.  Patient was given opportunity to ask questions. Patient verbalized understanding of the plan and was able to repeat key elements of the plan. All questions were answered to their satisfaction.    Inge Mangle, FNP, have reviewed all documentation for this visit. The documentation on 04/01/24 for the exam, diagnosis, procedures, and orders are all accurate and complete.   IF YOU HAVE BEEN REFERRED TO A SPECIALIST, IT MAY TAKE 1-2 WEEKS TO SCHEDULE/PROCESS THE REFERRAL. IF YOU HAVE NOT HEARD FROM US /SPECIALIST IN TWO WEEKS, PLEASE GIVE US  A CALL AT [phone].

## 2024-04-02 LAB — CMP14+EGFR
ALT: 10 IU/L (ref 0–32)
AST: 13 IU/L (ref 0–40)
Albumin: 3.7 g/dL — ABNORMAL LOW (ref 3.8–4.9)
Alkaline Phosphatase: 94 IU/L (ref 44–121)
BUN/Creatinine Ratio: 35 — ABNORMAL HIGH (ref 9–23)
BUN: 17 mg/dL (ref 6–24)
Bilirubin Total: 0.3 mg/dL (ref 0.0–1.2)
CO2: 23 mmol/L (ref 20–29)
Calcium: 9.6 mg/dL (ref 8.7–10.2)
Chloride: 104 mmol/L (ref 96–106)
Creatinine, Ser: 0.48 mg/dL — ABNORMAL LOW (ref 0.57–1.00)
Globulin, Total: 4.8 g/dL — ABNORMAL HIGH (ref 1.5–4.5)
Glucose: 92 mg/dL (ref 70–99)
Potassium: 4.2 mmol/L (ref 3.5–5.2)
Sodium: 141 mmol/L (ref 134–144)
Total Protein: 8.5 g/dL (ref 6.0–8.5)
eGFR: 111 mL/min/{1.73_m2} (ref >59)

## 2024-04-02 LAB — HIV ANTIBODY (ROUTINE TESTING W REFLEX): HIV Screen 4th Generation wRfx: NONREACTIVE

## 2024-04-02 LAB — HSV 1 AND 2 AB, IGG
HSV 1 Glycoprotein G Ab, IgG: REACTIVE — AB
HSV 2 IgG, Type Spec: REACTIVE — AB

## 2024-04-02 LAB — CBC WITH DIFFERENTIAL/PLATELET
Basophils Absolute: 0 10*3/uL (ref 0.0–0.2)
Basos: 1 %
EOS (ABSOLUTE): 0.2 10*3/uL (ref 0.0–0.4)
Eos: 3 %
Hematocrit: 34.1 % (ref 34.0–46.6)
Hemoglobin: 11.2 g/dL (ref 11.1–15.9)
Immature Grans (Abs): 0 10*3/uL (ref 0.0–0.1)
Immature Granulocytes: 0 %
Lymphocytes Absolute: 1.3 10*3/uL (ref 0.7–3.1)
Lymphs: 25 %
MCH: 30.3 pg (ref 26.6–33.0)
MCHC: 32.8 g/dL (ref 31.5–35.7)
MCV: 92 fL (ref 79–97)
Monocytes Absolute: 0.5 10*3/uL (ref 0.1–0.9)
Monocytes: 9 %
Neutrophils Absolute: 3.3 10*3/uL (ref 1.4–7.0)
Neutrophils: 62 %
Platelets: 300 10*3/uL (ref 150–450)
RBC: 3.7 x10E6/uL — ABNORMAL LOW (ref 3.77–5.28)
RDW: 13.1 % (ref 11.7–15.4)
WBC: 5.3 10*3/uL (ref 3.4–10.8)

## 2024-04-02 LAB — RPR: RPR Ser Ql: NONREACTIVE

## 2024-04-02 LAB — HEMOGLOBIN A1C
Est. average glucose Bld gHb Est-mCnc: 123 mg/dL
Hgb A1c MFr Bld: 5.9 % — ABNORMAL HIGH (ref 4.8–5.6)

## 2024-04-02 LAB — HEPATITIS C ANTIBODY: Hep C Virus Ab: NONREACTIVE

## 2024-04-02 LAB — BRAIN NATRIURETIC PEPTIDE: BNP: 26.2 pg/mL (ref 0.0–100.0)

## 2024-04-03 LAB — NUSWAB VAGINITIS PLUS (VG+)
Atopobium vaginae: HIGH {score} — AB
BVAB 2: HIGH {score} — AB
Candida albicans, NAA: NEGATIVE
Candida glabrata, NAA: NEGATIVE
Chlamydia trachomatis, NAA: NEGATIVE
Megasphaera 1: HIGH {score} — AB
Neisseria gonorrhoeae, NAA: NEGATIVE
Trich vag by NAA: NEGATIVE

## 2024-04-12 ENCOUNTER — Encounter: Payer: Self-pay | Admitting: Nurse Practitioner

## 2024-04-12 DIAGNOSIS — I10 Essential (primary) hypertension: Secondary | ICD-10-CM

## 2024-04-12 DIAGNOSIS — R9431 Abnormal electrocardiogram [ECG] [EKG]: Secondary | ICD-10-CM

## 2024-04-12 DIAGNOSIS — E6609 Other obesity due to excess calories: Secondary | ICD-10-CM

## 2024-04-12 DIAGNOSIS — E66811 Obesity, class 1: Secondary | ICD-10-CM | POA: Insufficient documentation

## 2024-04-12 HISTORY — DX: Abnormal electrocardiogram (ECG) (EKG): R94.31

## 2024-04-12 HISTORY — DX: Other obesity due to excess calories: E66.09

## 2024-04-12 HISTORY — DX: Essential (primary) hypertension: I10

## 2024-04-12 MED ORDER — METRONIDAZOLE 500 MG PO TABS: 500.0000 mg | ORAL_TABLET | Freq: Three times a day (TID) | ORAL | 0 refills | Status: AC

## 2024-04-12 NOTE — Assessment & Plan Note (Signed)

## 2024-04-12 NOTE — Assessment & Plan Note (Signed)
 She is encouraged to strive for BMI less than 30 to decrease cardiac risk. Advised to aim for at least 150 minutes of exercise per week.

## 2024-04-12 NOTE — Assessment & Plan Note (Signed)
 Declines shingrix, educated on disease process and is aware if he changes his mind to notify office

## 2024-04-12 NOTE — Assessment & Plan Note (Signed)
 Will check vaginal swab

## 2024-04-12 NOTE — Assessment & Plan Note (Addendum)
 Will treat with steroid injection and start her on singulair . She reports she has asthma but not officially diagnosed. Will start her on airsupra  as needed

## 2024-04-12 NOTE — Assessment & Plan Note (Signed)
 First degree AV block and left atrial enlargement these are new and will refer to Cardiology

## 2024-04-12 NOTE — Assessment & Plan Note (Addendum)
 She has 1+ edema to bilateral lower legs. She is encouraged to keep them elevated and wear support socks during the day. Will check BNP.

## 2024-04-12 NOTE — Assessment & Plan Note (Signed)
 Blood pressure is elevated, will start her on triamterene /hydrochlorothiazide  1/2 tab daily. Return to office in 6 weeks.

## 2024-04-12 NOTE — Assessment & Plan Note (Signed)
Pt instructed on Self Breast Exam.According to ACOG guidelines Women aged 57 and older are recommended to get an annual mammogram.   

## 2024-04-12 NOTE — Assessment & Plan Note (Signed)

## 2024-04-12 NOTE — Assessment & Plan Note (Signed)
 According to USPTF Colorectal cancer Screening guidelines. Colonoscopy is recommended every 10 years, starting at age 57 years. Will refer to GI for colon cancer screening.

## 2024-04-18 ENCOUNTER — Encounter: Payer: Self-pay | Admitting: Gastroenterology

## 2024-05-08 ENCOUNTER — Encounter

## 2024-05-14 ENCOUNTER — Ambulatory Visit: Admitting: Nurse Practitioner

## 2024-05-14 NOTE — Progress Notes (Deleted)
 Del Favia, CMA,acting as a Neurosurgeon for Brittany Epley, FNP.,have documented all relevant documentation on the behalf of Brittany Epley, FNP,as directed by  Brittany Epley, FNP while in the presence of Brittany Epley, FNP.  Subjective:  Patient ID: Brittany Beasley , female    DOB: 1966-07-09 , 58 y.o.   MRN: [id number]  No chief complaint on file.   HPI  HPI   Past Medical History:  Diagnosis Date   Asthma    Lymphedema 08/17/2016   LLE following cellulitis 06/2016     Family History  Problem Relation Age of Onset   Hypertension Mother    Alcohol abuse Father      Current Outpatient Medications:    acetaminophen  (TYLENOL ) 500 MG tablet, Take 1,000 mg by mouth every 6 (six) hours as needed for mild pain, moderate pain or headache. (Patient not taking: Reported on 04/01/2024), Disp: , Rfl:    Albuterol -Budesonide (AIRSUPRA ) 90-80 MCG/ACT AERO, Inhale 2 puffs into the lungs every 6 (six) hours as needed., Disp: 32.1 g, Rfl: 1   docusate sodium  (COLACE) 100 MG capsule, Take 1 capsule (100 mg total) by mouth at bedtime. As needed for constipation (Patient not taking: Reported on 04/01/2024), Disp: 30 capsule, Rfl: 3   ferrous sulfate  325 (65 FE) MG tablet, Take 1 tablet (325 mg total) by mouth daily with breakfast. (Patient not taking: Reported on 04/01/2024), Disp: 90 tablet, Rfl: 1   fluticasone  (FLONASE ) 50 MCG/ACT nasal spray, Place 1 spray into both nostrils daily. (Patient not taking: Reported on 04/01/2024), Disp: 16 g, Rfl: 6   furosemide  (LASIX ) 20 MG tablet, Take 2 tablets (40 mg total) by mouth daily for 10 days., Disp: 10 tablet, Rfl: 0   ibuprofen  (ADVIL ,MOTRIN ) 200 MG tablet, Take 600 mg by mouth every 6 (six) hours as needed for headache, mild pain or moderate pain. (Patient not taking: Reported on 04/01/2024), Disp: , Rfl:    montelukast  (SINGULAIR ) 10 MG tablet, Take 1 tablet (10 mg total) by mouth daily., Disp: 30 tablet, Rfl: 2   triamterene -hydrochlorothiazide  (MAXZIDE-25)  37.5-25 MG tablet, Take 1/2 tablet by mouth daily in am, Disp: 45 tablet, Rfl: 1   No Known Allergies   Review of Systems   There were no vitals filed for this visit. There is no height or weight on file to calculate BMI.  Wt Readings from Last 3 Encounters:  04/01/24 209 lb (94.8 kg)  06/27/23 228 lb (103.4 kg)  12/31/20 228 lb 4.8 oz (103.6 kg)    The ASCVD Risk score (Arnett DK, et al., 2019) failed to calculate for the following reasons:   Cannot find a previous HDL lab   Cannot find a previous total cholesterol lab  Objective:  Physical Exam      Assessment And Plan:  There are no diagnoses linked to this encounter.  No follow-ups on file.  Patient was given opportunity to ask questions. Patient verbalized understanding of the plan and was able to repeat key elements of the plan. All questions were answered to their satisfaction.    Inge Mangle, FNP, have reviewed all documentation for this visit. The documentation on 05/14/24 for the exam, diagnosis, procedures, and orders are all accurate and complete.   IF YOU HAVE BEEN REFERRED TO A SPECIALIST, IT MAY TAKE 1-2 WEEKS TO SCHEDULE/PROCESS THE REFERRAL. IF YOU HAVE NOT HEARD FROM US /SPECIALIST IN TWO WEEKS, PLEASE GIVE US  A CALL AT [phone].

## 2024-05-17 ENCOUNTER — Encounter

## 2024-05-17 ENCOUNTER — Telehealth: Payer: Self-pay

## 2024-05-17 NOTE — Telephone Encounter (Signed)
 No show for in person PV apt. 2 attempts made to offer PV via phone call. Unable to reach pt at this time. Pt made aware she would need to call the office by 5 PM to r/s her PV apt to avoid cancellation of her upcoming colonoscopy.

## 2024-06-03 ENCOUNTER — Ambulatory Visit

## 2024-06-14 ENCOUNTER — Encounter: Admitting: Gastroenterology

## 2024-08-06 ENCOUNTER — Encounter: Payer: Self-pay | Admitting: Nurse Practitioner

## 2024-08-15 ENCOUNTER — Encounter: Admitting: Family Medicine

## 2024-08-16 ENCOUNTER — Other Ambulatory Visit: Payer: Self-pay

## 2024-08-16 DIAGNOSIS — J45909 Unspecified asthma, uncomplicated: Secondary | ICD-10-CM | POA: Insufficient documentation

## 2024-08-20 ENCOUNTER — Ambulatory Visit

## 2024-10-23 ENCOUNTER — Ambulatory Visit

## 2024-12-20 ENCOUNTER — Ambulatory Visit: Payer: Self-pay

## 2024-12-20 NOTE — Telephone Encounter (Signed)
 FYI Only or Action Required?: FYI only for provider: appointment scheduled on 12/23/24.  Patient was last seen in primary care on 04/01/2024 by Georgina Speaks, FNP.  Called Nurse Triage reporting Pelvic Pain.  Symptoms began about a month ago.  Interventions attempted: OTC medications: Acetaminophen , Ibuprofen .  Symptoms are: unchanged.  Triage Disposition: See Physician Within 24 Hours  Patient/caregiver understands and will follow disposition?: Yes  Reason for Disposition  [1] MODERATE (e.g., interferes with normal activities) pelvic pain AND [2] pain comes and goes (cramps) AND [3] present > 24 hours  Answer Assessment - Initial Assessment Questions Patient states that she initially felt this pelvic pain about a month ago, but it subsided. Since then she has experienced this pain intermittently, last time being 2 days ago. Pain is 6/10 and she has been taking Tylenol  and Ibuprofen  to help. Office visit advised. Advised to go to UC if symptoms worsen prior to appointment.   1. LOCATION: Where does it hurt?      Lower abdomen/pelvis  2. RADIATION: Does the pain shoot anywhere else? (e.g., lower back, groin, thighs)     No  3. ONSET: When did the pain begin? (e.g., minutes, hours or days ago)      About a month ago  4. SUDDEN: Gradual or sudden onset?     Gradual  5. PATTERN Does the pain come and go, or is it constant?     Comes and goes  6. SEVERITY: How bad is the pain?  (e.g., Scale 1-10; mild, moderate, or severe)     6/10  7. RECURRENT SYMPTOM: Have you ever had this type of pelvic pain before? If Yes, ask: When was the last time? and What happened that time?      No  8. CAUSE: What do you think is causing the pelvic pain?     Possible infection  9. RELIEVING/AGGRAVATING FACTORS: What makes it better or worse? (e.g., activity/rest, sexual intercourse, voiding, passing stool)     Activity makes it worse  10. OTHER SYMPTOMS: Has there been any  other symptoms? (e.g., fever, constipation, diarrhea, urine problems, vaginal bleeding, vaginal discharge, or vomiting?       Denies any other symptoms  11. PREGNANCY: Is there any chance you are pregnant? When was your last menstrual period?       NA  Protocols used: Pelvic Pain - [facility]  Copied from CRM [id number]. Topic: Clinical - Red Word Triage >> Dec 20, 2024 12:21 PM Wess RAMAN wrote: Red Word that prompted transfer to Nurse Triage: Pain in hands when working. Experiencing UTI syptoms- pain during urination  Would like to reschedule annual physical. She needs a morning appt due to working 3p-11p

## 2024-12-23 ENCOUNTER — Ambulatory Visit: Payer: Self-pay | Admitting: Family Medicine

## 2024-12-26 ENCOUNTER — Encounter: Payer: Self-pay | Admitting: Nurse Practitioner

## 2025-01-24 ENCOUNTER — Encounter: Payer: Self-pay | Admitting: Family Medicine

## 2025-04-24 ENCOUNTER — Encounter: Payer: Self-pay | Admitting: Nurse Practitioner
